# Patient Record
Sex: Male | Born: 1990 | Race: Black or African American | Hispanic: No | Marital: Single | Smoking: Current every day smoker
Health system: Southern US, Community
[De-identification: ages and names within clinical notes are randomized; demographics above are authoritative.]

## PROBLEM LIST (undated history)

## (undated) DIAGNOSIS — R001 Bradycardia, unspecified: Secondary | ICD-10-CM

## (undated) DIAGNOSIS — N4821 Abscess of corpus cavernosum and penis: Secondary | ICD-10-CM

## (undated) HISTORY — PX: HAND SURGERY: SHX662

---

## 2011-02-01 ENCOUNTER — Encounter: Payer: Self-pay | Admitting: Emergency Medicine

## 2011-02-01 ENCOUNTER — Emergency Department (HOSPITAL_COMMUNITY)
Admission: EM | Admit: 2011-02-01 | Discharge: 2011-02-01 | Disposition: A | Discharge status: Home / Self Care | Payer: No Typology Code available for payment source | Attending: Emergency Medicine | Admitting: Emergency Medicine

## 2011-02-01 DIAGNOSIS — M549 Dorsalgia, unspecified: Secondary | ICD-10-CM | POA: Insufficient documentation

## 2011-02-01 DIAGNOSIS — F17200 Nicotine dependence, unspecified, uncomplicated: Secondary | ICD-10-CM | POA: Insufficient documentation
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
## 2011-09-11 ENCOUNTER — Encounter (HOSPITAL_COMMUNITY): Payer: Self-pay | Admitting: Emergency Medicine

## 2011-09-11 ENCOUNTER — Inpatient Hospital Stay (HOSPITAL_COMMUNITY)
Admission: EM | Admit: 2011-09-11 | Discharge: 2011-09-15 | DRG: 709 | Disposition: A | Discharge status: Home / Self Care | Payer: Self-pay | Attending: Internal Medicine | Admitting: Internal Medicine

## 2011-09-11 DIAGNOSIS — R6510 Systemic inflammatory response syndrome (SIRS) of non-infectious origin without acute organ dysfunction: Secondary | ICD-10-CM | POA: Diagnosis present

## 2011-09-11 DIAGNOSIS — D696 Thrombocytopenia, unspecified: Secondary | ICD-10-CM | POA: Diagnosis present

## 2011-09-11 DIAGNOSIS — L03818 Cellulitis of other sites: Secondary | ICD-10-CM | POA: Diagnosis present

## 2011-09-11 DIAGNOSIS — I498 Other specified cardiac arrhythmias: Secondary | ICD-10-CM | POA: Diagnosis present

## 2011-09-11 DIAGNOSIS — L02818 Cutaneous abscess of other sites: Secondary | ICD-10-CM | POA: Diagnosis present

## 2011-09-11 DIAGNOSIS — L0291 Cutaneous abscess, unspecified: Secondary | ICD-10-CM

## 2011-09-11 DIAGNOSIS — N4829 Other inflammatory disorders of penis: Principal | ICD-10-CM | POA: Diagnosis present

## 2011-09-11 DIAGNOSIS — F17200 Nicotine dependence, unspecified, uncomplicated: Secondary | ICD-10-CM | POA: Diagnosis present

## 2011-09-11 DIAGNOSIS — B958 Unspecified staphylococcus as the cause of diseases classified elsewhere: Secondary | ICD-10-CM | POA: Diagnosis present

## 2011-09-11 DIAGNOSIS — R001 Bradycardia, unspecified: Secondary | ICD-10-CM | POA: Diagnosis present

## 2011-09-11 DIAGNOSIS — Z720 Tobacco use: Secondary | ICD-10-CM | POA: Diagnosis present

## 2011-09-11 HISTORY — DX: Abscess of corpus cavernosum and penis: N48.21

## 2011-09-11 HISTORY — DX: Bradycardia, unspecified: R00.1

## 2011-09-11 LAB — COMPREHENSIVE METABOLIC PANEL
ALT: 16 U/L (ref 0–53)
Calcium: 10.1 mg/dL (ref 8.4–10.5)
Creatinine, Ser: 1.27 mg/dL (ref 0.50–1.35)
GFR calc Af Amer: >90 mL/min (ref >90)
Glucose, Bld: 82 mg/dL (ref 70–99)
Sodium: 137 mEq/L (ref 135–145)
Total Protein: 7.8 g/dL (ref 6.0–8.3)

## 2011-09-11 LAB — DIFFERENTIAL
Eosinophils Absolute: 0 10*3/uL (ref 0.0–0.7)
Eosinophils Relative: 0 % (ref 0–5)
Lymphs Abs: 3.2 10*3/uL (ref 0.7–4.0)

## 2011-09-11 LAB — URINALYSIS, ROUTINE W REFLEX MICROSCOPIC
Bilirubin Urine: NEGATIVE
Leukocytes, UA: NEGATIVE
Nitrite: NEGATIVE
Specific Gravity, Urine: >1.03 — ABNORMAL HIGH (ref 1.005–1.030)
pH: 6 (ref 5.0–8.0)

## 2011-09-11 LAB — CBC
MCH: 30 pg (ref 26.0–34.0)
MCV: 86.7 fL (ref 78.0–100.0)
Platelets: 150 10*3/uL (ref 150–400)
RDW: 14.5 % (ref 11.5–15.5)

## 2011-09-11 MED ORDER — ACETAMINOPHEN 325 MG PO TABS
650.0000 mg | ORAL_TABLET | Freq: Once | ORAL | Status: AC
  Administered 2011-09-11: 650 mg via ORAL
  Filled 2011-09-11: qty 2

## 2011-09-11 MED ORDER — DEXTROSE 5 % IV SOLN
500.0000 mg | Freq: Once | INTRAVENOUS | Status: DC
  Filled 2011-09-11: qty 500

## 2011-09-11 MED ORDER — DEXTROSE 5 % IV SOLN
1.0000 g | Freq: Once | INTRAVENOUS | Status: AC
  Administered 2011-09-11: 1 g via INTRAVENOUS
  Filled 2011-09-11: qty 10

## 2011-09-11 MED ORDER — PIPERACILLIN-TAZOBACTAM 3.375 G IVPB
3.3750 g | Freq: Three times a day (TID) | INTRAVENOUS | Status: DC
  Administered 2011-09-11 – 2011-09-12 (×2): 3.375 g via INTRAVENOUS
  Filled 2011-09-11 (×6): qty 50

## 2011-09-11 MED ORDER — LIDOCAINE HCL (PF) 1 % IJ SOLN
INTRAMUSCULAR | Status: AC
  Filled 2011-09-11: qty 10

## 2011-09-11 MED ORDER — HYDROMORPHONE HCL PF 1 MG/ML IJ SOLN
1.0000 mg | Freq: Once | INTRAMUSCULAR | Status: AC
  Administered 2011-09-11: 1 mg via INTRAVENOUS
  Filled 2011-09-11: qty 1

## 2011-09-11 MED ORDER — VANCOMYCIN HCL IN DEXTROSE 1-5 GM/200ML-% IV SOLN
1000.0000 mg | Freq: Two times a day (BID) | INTRAVENOUS | Status: DC
  Administered 2011-09-11 – 2011-09-13 (×4): 1000 mg via INTRAVENOUS
  Filled 2011-09-11 (×4): qty 200

## 2011-09-11 MED ORDER — LIDOCAINE HCL (PF) 1 % IJ SOLN
30.0000 mL | Freq: Once | INTRAMUSCULAR | Status: DC
  Filled 2011-09-11: qty 30

## 2011-09-11 MED ORDER — DIPHENHYDRAMINE HCL 50 MG/ML IJ SOLN
25.0000 mg | Freq: Once | INTRAMUSCULAR | Status: AC
  Administered 2011-09-11: 25 mg via INTRAVENOUS
  Filled 2011-09-11: qty 1

## 2011-09-11 MED ORDER — ONDANSETRON HCL 4 MG/2ML IJ SOLN
4.0000 mg | Freq: Once | INTRAMUSCULAR | Status: AC
  Administered 2011-09-11: 4 mg via INTRAVENOUS
  Filled 2011-09-11: qty 2

## 2011-09-11 MED ORDER — SODIUM CHLORIDE 0.9 % IV BOLUS (SEPSIS)
1000.0000 mL | Freq: Once | INTRAVENOUS | Status: AC
  Administered 2011-09-11: 1000 mL via INTRAVENOUS

## 2011-09-11 NOTE — ED Notes (Signed)
Pt presents with "abscess on base of penis shaft and on head of penis" x 2 days. Pt reports having "boils before"  That go away after "popping them". Pt also presents with Fever, tachycardia and chills. Pt denies other complaints. The entire penis is swollen at this time. " open sore on shaft of penis has clear drainage. Pt denies discharge from penis and difficulty urinating.

## 2011-09-11 NOTE — Progress Notes (Signed)
ANTIBIOTIC CONSULT NOTE - INITIAL  Pharmacy Consult for Vancomycin and Zosyn Indication:  Abscess Penile Area  No Known Allergies  Patient Measurements: Height: 5\' 8"  (172.7 cm) Weight: 160 lb (72.576 kg) IBW/kg (Calculated) : 68.4    Vital Signs: Temp: 99.8 F (37.7 C) (04/06 2015) Temp src: Oral (04/06 2015) BP: 121/65 mmHg (04/06 2015) Pulse Rate: 101  (04/06 2015) Intake/Output from previous day:   Intake/Output from this shift:    Labs:  [facility] 09/11/11 1833  WBC 17.9*  HGB 16.0  PLT 150  LABCREA --  CREATININE 1.27   Estimated Creatinine Clearance: 88.3 ml/min (by C-G formula based on Cr of 1.27). No results found for this basename: VANCOTROUGH:2,VANCOPEAK:2,VANCORANDOM:2,GENTTROUGH:2,GENTPEAK:2,GENTRANDOM:2,TOBRATROUGH:2,TOBRAPEAK:2,TOBRARND:2,AMIKACINPEAK:2,AMIKACINTROU:2,AMIKACIN:2, in the last 72 hours   Microbiology: No results found for this or any previous visit (from the past 720 hour(s)).  Medical History: History reviewed. No pertinent past medical history.  Medications:  Scheduled:    . acetaminophen  650 mg Oral Once  .  HYDROmorphone (DILAUDID) injection  1 mg Intravenous Once  . lidocaine  30 mL Infiltration Once  . lidocaine      . ondansetron  4 mg Intravenous Once  . sodium chloride  1,000 mL Intravenous Once  . DISCONTD: azithromycin  500 mg Intravenous Once   Assessment: Ok for protocol  Goal of Therapy:  Vancomycin trough level 15-20 mcg/ml  Plan:  Zosyn 3.375 GM IV every 8 hours Vancomycin 1 GM IV every 12 hours Measure antibiotic drug levels at steady state  [address], Wylan Gentzler [place] 09/11/2011,9:02 PM

## 2011-09-11 NOTE — ED Provider Notes (Signed)
History    Scribed for Glynn Octave, MD, the patient was seen in room APA09/APA09. This chart was scribed by Katha Cabal.   CSN: [id number]  Arrival date & time 09/11/11  1742   First MD Initiated Contact with Patient 09/11/11 1811      Chief Complaint  Patient presents with  . Recurrent Skin Infections    (Consider location/radiation/quality/duration/timing/severity/associated sxs/prior treatment) HPI  Pt was seen at 6:18 PM   Christian Rose is a 21 y.o. male who presents to the Emergency Department complaining of boil on penis that is getting bigger for past 2 days.  Symptoms are associated with fever and mild suprapubic abdominal pain. Symptoms are not associated with testicular pain or dysuria.  Patient has tried to drain boil but it did not drain.  No history of syphilis.  Patient has been tested for HIV and syphilis.  Patient last had intercourse about a week ago.  No IV drug use.  No history of chronic medical problems.    No PCP      History reviewed. No pertinent past medical history.  Past Surgical History  Procedure Date  . Hand surgery     Family History  Problem Relation Age of Onset  . Diabetes Mother   . Schizophrenia Father     History  Substance Use Topics  . Smoking status: Current Everyday Smoker -- 0.5 packs/day    Types: Cigarettes  . Smokeless tobacco: Not on file  . Alcohol Use: 4.0 oz/week    8 drink(s) per week      Review of Systems  All other systems reviewed and are negative.    Allergies  Review of patient's allergies indicates no known allergies.  Home Medications  No current outpatient prescriptions on file.  BP 121/65  Pulse 101  Temp(Src) 99.8 F (37.7 C) (Oral)  Resp 18  Ht 5\' 8"  (1.727 m)  Wt 160 lb (72.576 kg)  BMI 24.33 kg/m2  SpO2 96%  Physical Exam  Nursing note and vitals reviewed. Constitutional: He is oriented to person, place, and time. He appears well-developed and well-nourished. No distress.    HENT:  Head: Normocephalic and atraumatic.  Eyes: Conjunctivae and EOM are normal.  Neck: Neck supple.  Cardiovascular: Normal rate and intact distal pulses.   Pulmonary/Chest: Effort normal. No respiratory distress.  Abdominal: There is no tenderness. There is no rebound and no guarding.  Genitourinary:       Swelling and induration with a pustule draining pus on left side, induration spreads to right side of penis, suspected abcess on penile shaft, nlm testicles, no penile drainage, tender inguinal lymphadenopathy, no hernia.  No chancres.  Musculoskeletal: Normal range of motion. He exhibits no edema.  Neurological: He is alert and oriented to person, place, and time. No sensory deficit. Coordination normal.  Skin: Skin is warm and dry.       See GU exam   Psychiatric: He has a normal mood and affect. His behavior is normal.    ED Course  Procedures (including critical care time)   DIAGNOSTIC STUDIES: Oxygen Saturation is 100% on room air, normal by my interpretation.     COORDINATION OF CARE: 6:23 PM  Physical exam complete.  Will drain abscess.   6:30 PM  Zofran and Dilaudid ordered.    7:15 PM  Zithromax and Rocephin ordered.   7:24 PM  Consulted with urologist Dr. Jerre Simon.  Plan to admit patient.      LABS / RADIOLOGY:  Labs Reviewed  CBC - Abnormal; Notable for the following:    WBC 17.9 (*)    All other components within normal limits  DIFFERENTIAL - Abnormal; Notable for the following:    Neutro Abs 13.3 (*)    Monocytes Absolute 1.3 (*)    All other components within normal limits  COMPREHENSIVE METABOLIC PANEL - Abnormal; Notable for the following:    GFR calc non Af Amer 79 (*)    All other components within normal limits  URINALYSIS, ROUTINE W REFLEX MICROSCOPIC - Abnormal; Notable for the following:    Specific Gravity, Urine >1.030 (*)    All other components within normal limits  GC/CHLAMYDIA PROBE AMP, GENITAL  RPR   No results  found.       MDM  Penile cellulitis and swelling to the penile shaft for 2 days. Patient comes in tachycardic and febrile. No difficulty with urination, testicles benign.  Patient has abscess to penile shaft on exam with leukocytosis, tachycardia and fever consistent with sepsis. Start IV antibiotics, obtain STD labs  D/w urology Dr. Jerre Simon who agrees with admission for IV antibiotics and he'll consult on patient.  He does not recommend that I try to I+D abscess.         MEDICATIONS GIVEN IN THE E.D. Scheduled Meds:    . acetaminophen  650 mg Oral Once  . azithromycin  500 mg Intravenous Once  .  HYDROmorphone (DILAUDID) injection  1 mg Intravenous Once  . lidocaine  30 mL Infiltration Once  . lidocaine      . ondansetron  4 mg Intravenous Once  . sodium chloride  1,000 mL Intravenous Once   Continuous Infusions:    . cefTRIAXone (ROCEPHIN)  IV         IMPRESSION: 1. Cellulitis and abscess      NEW MEDICATIONS: New Prescriptions   No medications on file      I personally performed the services described in this documentation, which was scribed in my presence.  The recorded information has been reviewed and considered.        Glynn Octave, MD 09/12/11 1446

## 2011-09-11 NOTE — ED Notes (Signed)
Pt has ? Abcess on Penile area x 2 days

## 2011-09-11 NOTE — H&P (Signed)
PCP:   None  Consultants: Alleen Borne,  M.D.  Chief Complaint: Boil on penis for 3 days  HPI: Christian Rose is an 21 y.o. male.   No chronic medical problems, no personal history of diabetes, has been picking at a pimple arising from follicle at the base of his penis for the past 3 days. However, has been getting progressively larger and draining pus, related to his attempts to self manage by using hot compresses. As an extensive tear of the base of the penis is now become swollen inflamed, and he has developed tenderness in both groins, he came to the emergency room for evaluation.   He denies any dysuria, frequency or penile discharge. He denies fever or chills. Last intercourse one week ago.   Rewiew of Systems:  The patient denies anorexia, fever, weight loss,, vision loss, decreased hearing, hoarseness, chest pain, syncope, dyspnea on exertion, peripheral edema, balance deficits, hemoptysis, abdominal pain, melena, hematochezia, severe indigestion/heartburn, hematuria, incontinence, other genital sores, muscle weakness, suspicious skin lesions, transient blindness, difficulty walking, depression, unusual weight change, abnormal bleeding,  angioedema, and breast masses.    History reviewed. No pertinent past medical history.  Past Surgical History  Procedure Date  . Hand surgery     Medications:  HOME MEDS: Prior to Admission medications   Not on File     Allergies:  No Known Allergies  Social History:   reports that he has been smoking Cigarettes.  He has been smoking about .5 packs per day. He does not have any smokeless tobacco history on file. He reports that he drinks about 4 ounces of alcohol per week. He reports that he does not use illicit drugs.  Family History: Family History  Problem Relation Age of Onset  . Diabetes Mother   . Schizophrenia Father      Physical Exam: Filed Vitals:   09/11/11 1749 09/11/11 1907 09/11/11 2015  BP: 151/92  122/86 121/65  Pulse: 121 74 101  Temp: 100.2 F (37.9 C)  99.8 F (37.7 C)  TempSrc: Oral  Oral  Resp: 20 18   Height: 5\' 8"  (1.727 m)    Weight: 72.576 kg (160 lb)    SpO2: 100% 99% 96%   Blood pressure 121/65, pulse 101, temperature 99.8 F (37.7 C), temperature source Oral, resp. rate 18, height 5\' 8"  (1.727 m), weight 72.576 kg (160 lb), SpO2 96.00%.  GEN:  Pleasant young African American man lying in the stretcher in no acute distress; cooperative with exam PSYCH:  alert and oriented x4;  affect is appropriate. HEENT: Mucous membranes pink and anicteric; PERRLA; EOM intact; no cervical lymphadenopathy nor thyromegaly or carotid bruit; no JVD; Breasts:: Not examined CHEST WALL: No tenderness CHEST: Normal respiration, clear to auscultation bilaterally HEART: Tachycardic, regular rhythm.; no murmurs rubs or gallops BACK: No kyphosis or scoliosis; no CVA tenderness ABDOMEN: Scaphoid, soft non-tender; no masses, no organomegaly, normal abdominal bowel sounds;  Rectal Exam: Not done EXTREMITIES: No bone or joint deformity;  no edema; no ulcerations. Genitalia: Circumcised penis; exquisite tender induration of dorsal base of the penis; a dry pus at the left. No ulcers no chancre;  bilateral tender inguinal lymphadenopathy .  PULSES: 2+ and symmetric SKIN: Normal hydration no rash or ulceration CNS: Cranial nerves 2-12 grossly intact no focal lateralizing neurologic deficit   Labs & Imaging Results for orders placed during the hospital encounter of 09/11/11 (from the past 48 hour(s))  CBC     Status: Abnormal   Collection  Time   09/11/11  6:33 PM      Component Value Range Comment   WBC 17.9 (*) 4.0 - 10.5 (K/uL)    RBC 5.33  4.22 - 5.81 (MIL/uL)    Hemoglobin 16.0  13.0 - 17.0 (g/dL)    HCT 16.1  09.6 - 04.5 (%)    MCV 86.7  78.0 - 100.0 (fL)    MCH 30.0  26.0 - 34.0 (pg)    MCHC 34.6  30.0 - 36.0 (g/dL)    RDW 40.9  81.1 - 91.4 (%)    Platelets 150  150 - 400 (K/uL)     DIFFERENTIAL     Status: Abnormal   Collection Time   09/11/11  6:33 PM      Component Value Range Comment   Neutrophils Relative 74  43 - 77 (%)    Neutro Abs 13.3 (*) 1.7 - 7.7 (K/uL)    Lymphocytes Relative 18  12 - 46 (%)    Lymphs Abs 3.2  0.7 - 4.0 (K/uL)    Monocytes Relative 7  3 - 12 (%)    Monocytes Absolute 1.3 (*) 0.1 - 1.0 (K/uL)    Eosinophils Relative 0  0 - 5 (%)    Eosinophils Absolute 0.0  0.0 - 0.7 (K/uL)    Basophils Relative 0  0 - 1 (%)    Basophils Absolute 0.0  0.0 - 0.1 (K/uL)   COMPREHENSIVE METABOLIC PANEL     Status: Abnormal   Collection Time   09/11/11  6:33 PM      Component Value Range Comment   Sodium 137  135 - 145 (mEq/L)    Potassium 3.9  3.5 - 5.1 (mEq/L)    Chloride 100  96 - 112 (mEq/L)    CO2 24  19 - 32 (mEq/L)    Glucose, Bld 82  70 - 99 (mg/dL)    BUN 15  6 - 23 (mg/dL)    Creatinine, Ser 7.82  0.50 - 1.35 (mg/dL)    Calcium 95.6  8.4 - 10.5 (mg/dL)    Total Protein 7.8  6.0 - 8.3 (g/dL)    Albumin 4.1  3.5 - 5.2 (g/dL)    AST 21  0 - 37 (U/L)    ALT 16  0 - 53 (U/L)    Alkaline Phosphatase 87  39 - 117 (U/L)    Total Bilirubin 0.5  0.3 - 1.2 (mg/dL)    GFR calc non Af Amer 79 (*) >90 (mL/min)    GFR calc Af Amer >90  >90 (mL/min)   URINALYSIS, ROUTINE W REFLEX MICROSCOPIC     Status: Abnormal   Collection Time   09/11/11  6:49 PM      Component Value Range Comment   Color, Urine YELLOW  YELLOW     APPearance CLEAR  CLEAR     Specific Gravity, Urine >1.030 (*) 1.005 - 1.030     pH 6.0  5.0 - 8.0     Glucose, UA NEGATIVE  NEGATIVE (mg/dL)    Hgb urine dipstick NEGATIVE  NEGATIVE     Bilirubin Urine NEGATIVE  NEGATIVE     Ketones, ur NEGATIVE  NEGATIVE (mg/dL)    Protein, ur NEGATIVE  NEGATIVE (mg/dL)    Urobilinogen, UA 0.2  0.0 - 1.0 (mg/dL)    Nitrite NEGATIVE  NEGATIVE     Leukocytes, UA NEGATIVE  NEGATIVE  MICROSCOPIC NOT DONE ON URINES WITH NEGATIVE PROTEIN, BLOOD, LEUKOCYTES, NITRITE, OR GLUCOSE <1000 mg/dL.  No results  found.    Assessment Present on Admission:   SIRS .Cellulitis and abscess of shaft of penis, causing the above  .Tobacco abuse   PLAN: Broad-spectrum antibiotic to include [name]; Vigorous fluid hydration pain control  Counsel on nicotine cessation  Other plans as per orders.    Ryon Layton 09/11/2011, 10:22 PM

## 2011-09-12 ENCOUNTER — Encounter (HOSPITAL_COMMUNITY): Payer: Self-pay | Admitting: *Deleted

## 2011-09-12 LAB — CBC
HCT: 40.9 % (ref 39.0–52.0)
MCHC: 33 g/dL (ref 30.0–36.0)
RDW: 14.8 % (ref 11.5–15.5)

## 2011-09-12 LAB — GLUCOSE, CAPILLARY: Glucose-Capillary: 112 mg/dL — ABNORMAL HIGH (ref 70–99)

## 2011-09-12 LAB — RPR: RPR Ser Ql: NONREACTIVE

## 2011-09-12 MED ORDER — PIPERACILLIN-TAZOBACTAM 3.375 G IVPB
INTRAVENOUS | Status: AC
  Filled 2011-09-12: qty 50

## 2011-09-12 MED ORDER — HYDROMORPHONE HCL PF 1 MG/ML IJ SOLN: 0.5000 mg | INTRAMUSCULAR | Status: DC | PRN

## 2011-09-12 MED ORDER — ACETAMINOPHEN 650 MG RE SUPP: 650.0000 mg | Freq: Four times a day (QID) | RECTAL | Status: DC | PRN

## 2011-09-12 MED ORDER — OXYCODONE HCL 5 MG PO TABS
5.0000 mg | ORAL_TABLET | ORAL | Status: DC | PRN
Start: 2011-09-12 — End: 2011-09-14
  Administered 2011-09-12 – 2011-09-13 (×6): 5 mg via ORAL
  Filled 2011-09-12 (×6): qty 1

## 2011-09-12 MED ORDER — SODIUM CHLORIDE 0.9 % IJ SOLN
INTRAMUSCULAR | Status: AC
  Administered 2011-09-12: 15:00:00
  Filled 2011-09-12: qty 3

## 2011-09-12 MED ORDER — SODIUM CHLORIDE 0.9 % IJ SOLN
INTRAMUSCULAR | Status: AC
  Administered 2011-09-12: 16:00:00
  Filled 2011-09-12: qty 3

## 2011-09-12 MED ORDER — ENOXAPARIN SODIUM 40 MG/0.4ML ~~LOC~~ SOLN
40.0000 mg | Freq: Every day | SUBCUTANEOUS | Status: DC
  Administered 2011-09-12 – 2011-09-13 (×2): 40 mg via SUBCUTANEOUS
  Filled 2011-09-12 (×3): qty 0.4

## 2011-09-12 MED ORDER — TRAZODONE HCL 50 MG PO TABS: 25.0000 mg | ORAL_TABLET | Freq: Every evening | ORAL | Status: DC | PRN

## 2011-09-12 MED ORDER — SODIUM CHLORIDE 0.9 % IJ SOLN
INTRAMUSCULAR | Status: AC
  Administered 2011-09-12: 17:00:00
  Filled 2011-09-12: qty 3

## 2011-09-12 MED ORDER — POTASSIUM CHLORIDE IN NACL 20-0.9 MEQ/L-% IV SOLN
INTRAVENOUS | Status: DC
  Administered 2011-09-12 (×2): via INTRAVENOUS

## 2011-09-12 MED ORDER — NICOTINE 14 MG/24HR TD PT24: 14.0000 mg | MEDICATED_PATCH | Freq: Every day | TRANSDERMAL | Status: DC | PRN

## 2011-09-12 MED ORDER — ACETAMINOPHEN 325 MG PO TABS: 650.0000 mg | ORAL_TABLET | Freq: Four times a day (QID) | ORAL | Status: DC | PRN

## 2011-09-12 NOTE — Progress Notes (Signed)
Chart reviewed.  Subjective: Has some purulent drainage. Concerned that the infection might be spreading.  Objective: Vital signs in last 24 hours: Filed Vitals:   09/12/11 0459 09/12/11 0515 09/12/11 0957 09/12/11 1031  BP:  98/59  102/65  Pulse:  62 67 75  Temp:  97.9 F (36.6 C)  98.3 F (36.8 C)  TempSrc:  Oral  Oral  Resp:  18  18  Height:      Weight: 84.1 kg (185 lb 6.5 oz)     SpO2:  99% 99% 98%   Weight change:   Intake/Output Summary (Last 24 hours) at 09/12/11 1154 Last data filed at 09/12/11 0900  Gross per 24 hour  Intake    340 ml  Output   1450 ml  Net  -1110 ml   Physical Exam:  Gen nontoxic Lungs clear to auscultation bilaterally without wheeze rhonchi or rales Cardiovascular regular rate rhythm without murmurs gallops rubs Abdomen soft nontender nondistended Extremities no clubbing cyanosis or edema GU: Induration involving the dorsum of the penis. No erythema.  Lab Results: Basic Metabolic Panel:  Lab 09/11/11 1610  NA 137  K 3.9  CL 100  CO2 24  GLUCOSE 82  BUN 15  CREATININE 1.27  CALCIUM 10.1  MG --  PHOS --   Liver Function Tests:  Lab 09/11/11 1833  AST 21  ALT 16  ALKPHOS 87  BILITOT 0.5  PROT 7.8  ALBUMIN 4.1   No results found for this basename: LIPASE:2,AMYLASE:2 in the last 168 hours No results found for this basename: AMMONIA:2 in the last 168 hours CBC:  Lab 09/12/11 0535 09/11/11 1833  WBC 12.2* 17.9*  NEUTROABS -- 13.3*  HGB 13.5 16.0  HCT 40.9 46.2  MCV 88.0 86.7  PLT 132* 150   Cardiac Enzymes: No results found for this basename: CKTOTAL:3,CKMB:3,CKMBINDEX:3,TROPONINI:3 in the last 168 hours BNP: No results found for this basename: PROBNP:3 in the last 168 hours D-Dimer: No results found for this basename: DDIMER:2 in the last 168 hours CBG: No results found for this basename: GLUCAP:6 in the last 168 hours Hemoglobin A1C: No results found for this basename: HGBA1C in the last 168 hours Fasting  Lipid Panel: No results found for this basename: CHOL,HDL,LDLCALC,TRIG,CHOLHDL,LDLDIRECT in the last 960 hours Thyroid Function Tests: No results found for this basename: TSH,T4TOTAL,FREET4,T3FREE,THYROIDAB in the last 168 hours Coagulation: No results found for this basename: LABPROT:4,INR:4 in the last 168 hours Anemia Panel: No results found for this basename: VITAMINB12,FOLATE,FERRITIN,TIBC,IRON,RETICCTPCT in the last 168 hours Urine Drug Screen: Drugs of Abuse  No results found for this basename: labopia, cocainscrnur, labbenz, amphetmu, thcu, labbarb    Alcohol Level: No results found for this basename: ETH:2 in the last 168 hours Urinalysis:  Lab 09/11/11 1849  COLORURINE YELLOW  LABSPEC >1.030*  PHURINE 6.0  GLUCOSEU NEGATIVE  HGBUR NEGATIVE  BILIRUBINUR NEGATIVE  KETONESUR NEGATIVE  PROTEINUR NEGATIVE  UROBILINOGEN 0.2  NITRITE NEGATIVE  LEUKOCYTESUR NEGATIVE   Micro Results: No results found for this or any previous visit (from the past 240 hour(s)). Studies/Results: No results found.  Scheduled Meds:   . acetaminophen  650 mg Oral Once  . cefTRIAXone (ROCEPHIN)  IV  1 g Intravenous Once  . diphenhydrAMINE  25 mg Intravenous Once  . enoxaparin  40 mg Subcutaneous Daily  .  HYDROmorphone (DILAUDID) injection  1 mg Intravenous Once  . lidocaine      . ondansetron  4 mg Intravenous Once  . sodium chloride  1,000  mL Intravenous Once  . vancomycin  1,000 mg Intravenous Q12H  . DISCONTD: azithromycin  500 mg Intravenous Once  . DISCONTD: lidocaine  30 mL Infiltration Once  . DISCONTD: piperacillin-tazobactam (ZOSYN)  IV  3.375 g Intravenous Q8H   Continuous Infusions:   . 0.9 % NaCl with KCl 20 mEq / L 150 mL/hr at 09/12/11 0226   PRN Meds:.acetaminophen, acetaminophen, HYDROmorphone, nicotine, oxyCODONE, traZODone Assessment/Plan: Active Problems:  Cellulitis and abscess of other specified site, likely staph infection. Continue vancomycin. Stop Zosyn. Will  order warm compresses to promote drainage. Await urology evaluation. RPR, GC, chlamydia pending. Decrease IV fluids.  Tobacco abuse   LOS: 1 day   Ever Gustafson L 09/12/2011, 11:54 AM

## 2011-09-12 NOTE — Consult Note (Signed)
Report#[id number]

## 2011-09-12 NOTE — Progress Notes (Addendum)
ANTIBIOTIC CONSULT NOTE   Pharmacy Consult for Vancomycin and Zosyn Indication: Cellulitis and abscess at base of penis  No Known Allergies  Patient Measurements: Height: 5\' 9"  (175.3 cm) Weight: 185 lb 6.5 oz (84.1 kg) IBW/kg (Calculated) : 70.7   Vital Signs: Temp: 97.9 F (36.6 C) (04/07 0515) Temp src: Oral (04/07 0515) BP: 98/59 mmHg (04/07 0515) Pulse Rate: 67  (04/07 0957) Intake/Output from previous day: 04/06 0701 - 04/07 0700 In: 340 [P.O.:340] Out: 1000 [Urine:1000] Intake/Output from this shift:    Labs:  Basename 09/12/11 0535 09/11/11 1833  WBC 12.2* 17.9*  HGB 13.5 16.0  PLT 132* 150  LABCREA -- --  CREATININE -- 1.27   Estimated Creatinine Clearance: 91.2 ml/min (by C-G formula based on Cr of 1.27). No results found for this basename: VANCOTROUGH:2,VANCOPEAK:2,VANCORANDOM:2,GENTTROUGH:2,GENTPEAK:2,GENTRANDOM:2,TOBRATROUGH:2,TOBRAPEAK:2,TOBRARND:2,AMIKACINPEAK:2,AMIKACINTROU:2,AMIKACIN:2, in the last 72 hours   Microbiology: No results found for this or any previous visit (from the past 720 hour(s)).  Medical History: History reviewed. No pertinent past medical history.  Medications:  Scheduled:     . acetaminophen  650 mg Oral Once  . cefTRIAXone (ROCEPHIN)  IV  1 g Intravenous Once  . diphenhydrAMINE  25 mg Intravenous Once  . enoxaparin  40 mg Subcutaneous Daily  .  HYDROmorphone (DILAUDID) injection  1 mg Intravenous Once  . lidocaine      . ondansetron  4 mg Intravenous Once  . piperacillin-tazobactam (ZOSYN)  IV  3.375 g Intravenous Q8H  . sodium chloride  1,000 mL Intravenous Once  . vancomycin  1,000 mg Intravenous Q12H  . DISCONTD: azithromycin  500 mg Intravenous Once  . DISCONTD: lidocaine  30 mL Infiltration Once   Assessment: Ok for protocol  Goal of Therapy:  Vancomycin trough level 10-15  Plan:  Zosyn 3.375 GM IV every 8 hours Vancomycin 1 GM IV every 12 hours Check trough tomorrow  Margo Aye, Daouda Lonzo A 09/12/2011,10:15  AM

## 2011-09-13 DIAGNOSIS — D696 Thrombocytopenia, unspecified: Secondary | ICD-10-CM | POA: Diagnosis not present

## 2011-09-13 LAB — GC/CHLAMYDIA PROBE AMP, GENITAL
Chlamydia, DNA Probe: NEGATIVE
GC Probe Amp, Genital: NEGATIVE

## 2011-09-13 MED ORDER — VANCOMYCIN HCL IN DEXTROSE 1-5 GM/200ML-% IV SOLN
1000.0000 mg | Freq: Three times a day (TID) | INTRAVENOUS | Status: DC
  Administered 2011-09-13 – 2011-09-14 (×3): 1000 mg via INTRAVENOUS
  Filled 2011-09-13 (×6): qty 200

## 2011-09-13 MED ORDER — SODIUM CHLORIDE 0.9 % IJ SOLN
INTRAMUSCULAR | Status: AC
  Filled 2011-09-13: qty 3

## 2011-09-13 NOTE — Progress Notes (Signed)
Subjective  Patient says that the abscessed area appears to be draining more. He has mild discomfort but no outright pain. He has no pain with urination. No subjective fever or chills.  Objective: Vital signs in last 24 hours: Filed Vitals:   09/12/11 2141 09/13/11 0230 09/13/11 0500 09/13/11 1004  BP: 124/74 112/64 129/72 114/68  Pulse: 78 65 69 97  Temp: 98.6 F (37 C) 98.3 F (36.8 C) 97.3 F (36.3 C) 97.8 F (36.6 C)  TempSrc: Oral Oral Oral Oral  Resp: 18 18 18 20   Height:      Weight:   82.4 kg (181 lb 10.5 oz)   SpO2: 98% 99% 97% 99%   Weight change: 9.824 kg (21 lb 10.5 oz)  Intake/Output Summary (Last 24 hours) at 09/13/11 1123 Last data filed at 09/13/11 1013  Gross per 24 hour  Intake    880 ml  Output   2850 ml  Net  -1970 ml   Physical Exam:  Gen nontoxic Lungs clear to auscultation bilaterally without wheeze rhonchi or rales Cardiovascular regular rate rhythm without murmurs gallops rubs Abdomen soft nontender nondistended Extremities no clubbing cyanosis or edema GU: Induration involving the dorsum of the penis. Small areas pimple- like abscesses  actively draining a small amount of purulent fluid. Tender to palpation.  Lab Results: Basic Metabolic Panel:  Lab 09/11/11 1610  NA 137  K 3.9  CL 100  CO2 24  GLUCOSE 82  BUN 15  CREATININE 1.27  CALCIUM 10.1  MG --  PHOS --   Liver Function Tests:  Lab 09/11/11 1833  AST 21  ALT 16  ALKPHOS 87  BILITOT 0.5  PROT 7.8  ALBUMIN 4.1   No results found for this basename: LIPASE:2,AMYLASE:2 in the last 168 hours No results found for this basename: AMMONIA:2 in the last 168 hours CBC:  Lab 09/12/11 0535 09/11/11 1833  WBC 12.2* 17.9*  NEUTROABS -- 13.3*  HGB 13.5 16.0  HCT 40.9 46.2  MCV 88.0 86.7  PLT 132* 150   Cardiac Enzymes: No results found for this basename: CKTOTAL:3,CKMB:3,CKMBINDEX:3,TROPONINI:3 in the last 168 hours BNP: No results found for this basename: PROBNP:3 in the  last 168 hours D-Dimer: No results found for this basename: DDIMER:2 in the last 168 hours CBG:  Lab 09/12/11 2057  GLUCAP 112*   Hemoglobin A1C: No results found for this basename: HGBA1C in the last 168 hours Fasting Lipid Panel: No results found for this basename: CHOL,HDL,LDLCALC,TRIG,CHOLHDL,LDLDIRECT in the last 960 hours Thyroid Function Tests: No results found for this basename: TSH,T4TOTAL,FREET4,T3FREE,THYROIDAB in the last 168 hours Coagulation: No results found for this basename: LABPROT:4,INR:4 in the last 168 hours Anemia Panel: No results found for this basename: VITAMINB12,FOLATE,FERRITIN,TIBC,IRON,RETICCTPCT in the last 168 hours Urine Drug Screen: Drugs of Abuse  No results found for this basename: labopia,  cocainscrnur,  labbenz,  amphetmu,  thcu,  labbarb    Alcohol Level: No results found for this basename: ETH:2 in the last 168 hours Urinalysis:  Lab 09/11/11 1849  COLORURINE YELLOW  LABSPEC >1.030*  PHURINE 6.0  GLUCOSEU NEGATIVE  HGBUR NEGATIVE  BILIRUBINUR NEGATIVE  KETONESUR NEGATIVE  PROTEINUR NEGATIVE  UROBILINOGEN 0.2  NITRITE NEGATIVE  LEUKOCYTESUR NEGATIVE   Micro Results: No results found for this or any previous visit (from the past 240 hour(s)). Studies/Results: No results found.  Scheduled Meds:    . enoxaparin  40 mg Subcutaneous Daily  . sodium chloride      . sodium chloride      .  sodium chloride      . vancomycin  1,000 mg Intravenous Q8H  . DISCONTD: piperacillin-tazobactam (ZOSYN)  IV  3.375 g Intravenous Q8H  . DISCONTD: vancomycin  1,000 mg Intravenous Q12H   Continuous Infusions:    . 0.9 % NaCl with KCl 20 mEq / L 20 mL/hr at 09/12/11 1410   PRN Meds:.acetaminophen, acetaminophen, HYDROmorphone, nicotine, oxyCODONE, traZODone Assessment/Plan:  Active Problems:  Cellulitis and abscess of other specified site, likely staph infection. Continue vancomycin and Rocephin. The Zosyn was discontinued yesterday.  Continue warm compresses to promote drainage. Dr. Fawn Kirk consult noted and appreciated. RPR is negative the GC and Chlamydia are still pending.   Tobacco abuse. Tobacco cessation counseling was ordered.  Thrombocytopenia. This may be secondary to antibiotics. His platelet count will be followed.    LOS: 2 days   Christian Rose 09/13/2011, 11:23 AM

## 2011-09-13 NOTE — Progress Notes (Signed)
Report [phone]

## 2011-09-13 NOTE — Progress Notes (Signed)
ANTIBIOTIC CONSULT NOTE   Pharmacy Consult for Vancomycin Indication: Cellulitis and abscess at base of penis  No Known Allergies  Patient Measurements: Height: 5\' 9"  (175.3 cm) Weight: 181 lb 10.5 oz (82.4 kg) IBW/kg (Calculated) : 70.7   Vital Signs: Temp: 97.8 F (36.6 C) (04/08 1004) Temp src: Oral (04/08 1004) BP: 114/68 mmHg (04/08 1004) Pulse Rate: 97  (04/08 1004) Intake/Output from previous day: 04/07 0701 - 04/08 0700 In: 240 [P.O.:240] Out: 2950 [Urine:2950] Intake/Output from this shift: Total I/O In: 640 [P.O.:640] Out: 350 [Urine:350]  Labs:  [facility] 09/12/11 0535 09/11/11 1833  WBC 12.2* 17.9*  HGB 13.5 16.0  PLT 132* 150  LABCREA -- --  CREATININE -- 1.27   Estimated Creatinine Clearance: 91.2 ml/min (by C-G formula based on Cr of 1.27).  Basename 09/13/11 0837  VANCOTROUGH <5.0*  VANCOPEAK --  Drue Dun --  GENTTROUGH --  GENTPEAK --  GENTRANDOM --  TOBRATROUGH --  TOBRAPEAK --  TOBRARND --  AMIKACINPEAK --  AMIKACINTROU --  AMIKACIN --     Microbiology: No results found for this or any previous visit (from the past 720 hour(s)).  Medical History: History reviewed. No pertinent past medical history.  Medications:  Scheduled:     . enoxaparin  40 mg Subcutaneous Daily  . sodium chloride      . sodium chloride      . sodium chloride      . vancomycin  1,000 mg Intravenous Q12H  . DISCONTD: piperacillin-tazobactam (ZOSYN)  IV  3.375 g Intravenous Q8H   Assessment: Ok for protocol Trough below goal.  Goal of Therapy:  Vancomycin trough level 10-15  Plan:  Increase Vancomycin 1 GM IV every 8 hours Check trough tomorrow  Mady Gemma 09/13/2011,10:20 AM

## 2011-09-13 NOTE — Consult Note (Signed)
NAME:  Christian Rose, VORE NO.:  [id number]  MEDICAL RECORD NO.:  [id number]  LOCATION:  A305                          FACILITY:  [facility]  PHYSICIAN:  Ky Barban, M.D.DATE OF BIRTH:  07-19-1990  DATE OF CONSULTATION: DATE OF DISCHARGE:                                CONSULTATION   CHIEF COMPLAINT:  Painful swelling of the base of the penis.  HISTORY:  A 21 year old gentleman who was admitted through the emergency room yesterday.  I called him to see him today because he has a painful swelling on the base of his penis.  He noticed a pimple on the base of the penis, and 3 days ago, started getting progressively worse with swelling and also painful knots in both groin.  No fever or chills.  He has no urinary symptoms.  He was admitted in the hospital with diagnosis of cellulitis, and started him on vancomycin, since yesterday, he is feeling better.  I do not see any drainage from the penis.  That area is dry and extremely tender.  The swelling he says it has gone down.  It is slightly fluctuant, but the fluctuation is not extensive, most of it is induration.  PAST MEDICAL HISTORY:  He denies any history of diabetes or hypertension or any other serious medical problem.  PERSONAL HISTORY:  Smokes 5 packs of cigarettes daily and drinks 4 ounces of alcohol per week.  REVIEW OF SYSTEMS:  Unremarkable.  PHYSICAL EXAMINATION:  VITAL SIGNS:  Blood pressure is 151/92, temperature is 100.2 on admission, today it is 98.6.  Pulse is 101 per minute.  On admission, today it is normal. ABDOMEN:  On examination, abdomen is soft, flat.  Liver, spleen, kidneys not palpable. 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  There is an area of the base of the penis, midline, dorsally.  There is dried pimple like area, but no drainage, but the area around it maybe 1 cm totally, slightly fluctuant.   Rest of the area which spreads into the prepubic space is indurated and tender, and also there is some tender lymph node palpable in the right groin, nothing on the left side.  The lymph node which is tender and palpable is discrete and mobile, located on the right.  Groin and the size is about 1 cm.  WBC count on admission was 17.9, today it is 12.2.  His hematocrit on admission 46.2, and today it is 40.9.  Urinalysis also looks normal.  IMPRESSION:  Cellulitis, base of the penis.  RECOMMEND:  Continue IV vancomycin.  I will check him again.  His problem might get better just with antibiotic.  It is very small fluctuant area, so I am not going to drain it, especially he is getting better.  I will recheck it tomorrow, then decide if I need to incise this and drain it.     Ky Barban, M.D.     MIJ/MEDQ  D:  09/12/2011  T:  09/13/2011  Job:  [id number]

## 2011-09-14 ENCOUNTER — Encounter (HOSPITAL_COMMUNITY)
Admission: EM | Disposition: A | Discharge status: Home / Self Care | Payer: Self-pay | Source: Home / Self Care | Attending: Internal Medicine

## 2011-09-14 ENCOUNTER — Encounter (HOSPITAL_COMMUNITY): Payer: Self-pay | Admitting: *Deleted

## 2011-09-14 ENCOUNTER — Inpatient Hospital Stay (HOSPITAL_COMMUNITY): Payer: Self-pay | Admitting: Anesthesiology

## 2011-09-14 ENCOUNTER — Encounter (HOSPITAL_COMMUNITY): Payer: Self-pay | Admitting: Anesthesiology

## 2011-09-14 HISTORY — PX: IRRIGATION AND DEBRIDEMENT ABSCESS: SHX5252

## 2011-09-14 SURGERY — IRRIGATION AND DEBRIDEMENT ABSCESS
Anesthesia: General | Site: Penis | Wound class: Dirty or Infected

## 2011-09-14 MED ORDER — VANCOMYCIN HCL IN DEXTROSE 1-5 GM/200ML-% IV SOLN: 1000.0000 mg | INTRAVENOUS | Status: DC

## 2011-09-14 MED ORDER — ONDANSETRON HCL 4 MG/2ML IJ SOLN: 4.0000 mg | Freq: Once | INTRAMUSCULAR | Status: DC | PRN

## 2011-09-14 MED ORDER — FENTANYL CITRATE 0.05 MG/ML IJ SOLN
INTRAMUSCULAR | Status: AC
  Administered 2011-09-14: 50 ug via INTRAVENOUS
  Filled 2011-09-14: qty 5

## 2011-09-14 MED ORDER — FENTANYL CITRATE 0.05 MG/ML IJ SOLN
INTRAMUSCULAR | Status: AC
  Administered 2011-09-14: 50 ug via INTRAVENOUS
  Filled 2011-09-14: qty 2

## 2011-09-14 MED ORDER — PROPOFOL 10 MG/ML IV EMUL
INTRAVENOUS | Status: AC
  Filled 2011-09-14: qty 20

## 2011-09-14 MED ORDER — MIDAZOLAM HCL 2 MG/2ML IJ SOLN
INTRAMUSCULAR | Status: AC
  Filled 2011-09-14: qty 2

## 2011-09-14 MED ORDER — LIDOCAINE HCL (PF) 1 % IJ SOLN
INTRAMUSCULAR | Status: AC
  Filled 2011-09-14: qty 5

## 2011-09-14 MED ORDER — FENTANYL CITRATE 0.05 MG/ML IJ SOLN
INTRAMUSCULAR | Status: DC | PRN
  Administered 2011-09-14: 50 ug via INTRAVENOUS
  Administered 2011-09-14: 25 ug via INTRAVENOUS
  Administered 2011-09-14: 50 ug via INTRAVENOUS
  Administered 2011-09-14 (×3): 25 ug via INTRAVENOUS
  Administered 2011-09-14: 50 ug via INTRAVENOUS

## 2011-09-14 MED ORDER — VANCOMYCIN HCL IN DEXTROSE 1-5 GM/200ML-% IV SOLN
1000.0000 mg | Freq: Three times a day (TID) | INTRAVENOUS | Status: DC
  Administered 2011-09-14 – 2011-09-15 (×3): 1000 mg via INTRAVENOUS
  Filled 2011-09-14 (×6): qty 200

## 2011-09-14 MED ORDER — LACTATED RINGERS IV SOLN
INTRAVENOUS | Status: DC
  Administered 2011-09-14: 1000 mL via INTRAVENOUS

## 2011-09-14 MED ORDER — OXYCODONE-ACETAMINOPHEN 5-325 MG PO TABS
1.0000 | ORAL_TABLET | ORAL | Status: DC | PRN
  Administered 2011-09-14 – 2011-09-15 (×2): 1 via ORAL
  Filled 2011-09-14 (×2): qty 1

## 2011-09-14 MED ORDER — LIDOCAINE HCL 1 % IJ SOLN
INTRAMUSCULAR | Status: DC | PRN
  Administered 2011-09-14: 40 mg via INTRADERMAL

## 2011-09-14 MED ORDER — PROPOFOL 10 MG/ML IV BOLUS
INTRAVENOUS | Status: DC | PRN
  Administered 2011-09-14: 150 mg via INTRAVENOUS
  Administered 2011-09-14: 50 mg via INTRAVENOUS

## 2011-09-14 MED ORDER — FENTANYL CITRATE 0.05 MG/ML IJ SOLN
25.0000 ug | INTRAMUSCULAR | Status: DC | PRN
  Administered 2011-09-14 (×2): 50 ug via INTRAVENOUS

## 2011-09-14 MED ORDER — ONDANSETRON HCL 4 MG/2ML IJ SOLN
4.0000 mg | INTRAMUSCULAR | Status: DC | PRN
  Administered 2011-09-14: 4 mg via INTRAVENOUS
  Filled 2011-09-14: qty 2

## 2011-09-14 MED ORDER — MIDAZOLAM HCL 2 MG/2ML IJ SOLN
INTRAMUSCULAR | Status: AC
  Administered 2011-09-14: 1 mg via INTRAVENOUS
  Filled 2011-09-14: qty 2

## 2011-09-14 MED ORDER — DEXTROSE-NACL 5-0.45 % IV SOLN
INTRAVENOUS | Status: DC
  Administered 2011-09-14 – 2011-09-15 (×2): via INTRAVENOUS

## 2011-09-14 MED ORDER — MIDAZOLAM HCL 2 MG/2ML IJ SOLN
1.0000 mg | INTRAMUSCULAR | Status: DC | PRN
  Administered 2011-09-14: 2 mg via INTRAVENOUS
  Administered 2011-09-14: 1 mg via INTRAVENOUS

## 2011-09-14 SURGICAL SUPPLY — 20 items
BANDAGE CONFORM 2  STR LF (GAUZE/BANDAGES/DRESSINGS) ×4 IMPLANT
CLOTH BEACON ORANGE TIMEOUT ST (SAFETY) ×2 IMPLANT
COVER SURGICAL LIGHT HANDLE (MISCELLANEOUS) ×4 IMPLANT
ELECT REM PT RETURN 9FT ADLT (ELECTROSURGICAL) ×2
ELECTRODE REM PT RTRN 9FT ADLT (ELECTROSURGICAL) ×1 IMPLANT
GAUZE PACKING IODOFORM 1/2 (PACKING) ×4 IMPLANT
GLOVE BIOGEL M 7.0 STRL (GLOVE) ×2 IMPLANT
GLOVE SS BIOGEL STRL SZ 6.5 (GLOVE) ×1 IMPLANT
GLOVE SUPERSENSE BIOGEL SZ 6.5 (GLOVE) ×1
GOWN STRL REIN XL XLG (GOWN DISPOSABLE) ×4 IMPLANT
KIT ROOM TURNOVER AP CYSTO (KITS) ×2 IMPLANT
MANIFOLD NEPTUNE II (INSTRUMENTS) ×2 IMPLANT
NS IRRIG 1000ML POUR BTL (IV SOLUTION) ×2 IMPLANT
PACK MINOR (CUSTOM PROCEDURE TRAY) ×2 IMPLANT
PAD ARMBOARD 7.5X6 YLW CONV (MISCELLANEOUS) ×2 IMPLANT
SET BASIN LINEN APH (SET/KITS/TRAYS/PACK) ×2 IMPLANT
SPONGE GAUZE 4X4 12PLY (GAUZE/BANDAGES/DRESSINGS) ×4 IMPLANT
SWAB CULTURE LIQ STUART DBL (MISCELLANEOUS) ×2 IMPLANT
TOWEL OR 17X26 4PK STRL BLUE (TOWEL DISPOSABLE) ×2 IMPLANT
TUBE ANAEROBIC PORT A CUL  W/M (MISCELLANEOUS) ×2 IMPLANT

## 2011-09-14 NOTE — Transfer of Care (Signed)
Immediate Anesthesia Transfer of Care Note  Patient: Christian Rose  Procedure(s) Performed: Procedure(s) (LRB): IRRIGATION AND DEBRIDEMENT ABSCESS (N/A)  Patient Location: PACU  Anesthesia Type: General  Level of Consciousness: awake, alert  and oriented  Airway & Oxygen Therapy: Patient Spontanous Breathing and Patient connected to face mask oxygen  Post-op Assessment: Report given to PACU RN  Post vital signs: Reviewed and stable  Complications: No apparent anesthesia complications

## 2011-09-14 NOTE — Brief Op Note (Signed)
09/11/2011 - 09/14/2011  1:15 PM  PATIENT:  Christian Rose  21 y.o. male  PRE-OPERATIVE DIAGNOSIS:  abscess to penis  POST-OPERATIVE DIAGNOSIS:  abscess to penis  PROCEDURE:  Procedure(s) (LRB): IRRIGATION AND DEBRIDEMENT ABSCESS (N/A)  SURGEON:  Surgeon(s) and Role:    * Ky Barban, MD - Primary  PHYSICIAN ASSISTANT:   ASSISTANTS: none   ANESTHESIA:   general  EBL:  Total I/O In: 200 [I.V.:200] Out: 15 [Blood:15]  BLOOD ADMINISTERED:none  DRAINS: none   LOCAL MEDICATIONS USED:  NONE  SPECIMEN:  Source of Specimen:  Culture taken from the wound  DISPOSITION OF SPECIMEN:  N/A  COUNTS:  YES  TOURNIQUET:  * No tourniquets in log *  DICTATION: .Other Dictation: Dictation Number dictation#[id number]  PLAN OF CARE: Discharge to home after PACU  PATIENT DISPOSITION:  PACU - hemodynamically stable.   Delay start of Pharmacological VTE agent (>24hrs) due to surgical blood loss or risk of bleeding:

## 2011-09-14 NOTE — Anesthesia Preprocedure Evaluation (Signed)
Anesthesia Evaluation  Patient identified by MRN, date of birth, ID band Patient awake    Reviewed: Allergy & Precautions, H&P , NPO status , Patient's Chart, lab work & pertinent test results  History of Anesthesia Complications Negative for: history of anesthetic complications  Airway Mallampati: II      Dental  (+) Teeth Intact   Pulmonary neg pulmonary ROS, Current Smoker,  breath sounds clear to auscultation        Cardiovascular negative cardio ROS  Rhythm:Regular     Neuro/Psych    GI/Hepatic   Endo/Other    Renal/GU      Musculoskeletal   Abdominal   Peds  Hematology   Anesthesia Other Findings   Reproductive/Obstetrics                           Anesthesia Physical Anesthesia Plan  ASA: I  Anesthesia Plan: General   Post-op Pain Management:    Induction: Intravenous  Airway Management Planned: LMA  Additional Equipment:   Intra-op Plan:   Post-operative Plan: Extubation in OR  Informed Consent: I have reviewed the patients History and Physical, chart, labs and discussed the procedure including the risks, benefits and alternatives for the proposed anesthesia with the patient or authorized representative who has indicated his/her understanding and acceptance.     Plan Discussed with:   Anesthesia Plan Comments:         Anesthesia Quick Evaluation

## 2011-09-14 NOTE — Progress Notes (Signed)
Subjective  The patient recently got back from surgery. He still feels a little groggy. He believes that the pain in his penis is beginning to set in. No nausea or vomiting.    Objective: Vital signs in last 24 hours: Filed Vitals:   09/14/11 1408 09/14/11 1426 09/14/11 1505 09/14/11 1540  BP:  135/73 111/59 105/66  Pulse: 88 56 50 53  Temp:  97 F (36.1 C) 98 F (36.7 C) 98 F (36.7 C)  TempSrc:      Resp: 17 16 16 16   Height:      Weight:      SpO2: 100% 95% 94% 98%   Weight change:   Intake/Output Summary (Last 24 hours) at 09/14/11 1626 Last data filed at 09/14/11 1600  Gross per 24 hour  Intake   1040 ml  Output   1015 ml  Net     25 ml   Physical Exam:  Gen nontoxic Lungs clear to auscultation bilaterally without wheeze rhonchi or rales Cardiovascular regular rate rhythm without murmurs gallops rubs Abdomen soft nontender nondistended Extremities no clubbing cyanosis or edema GU: post surgical dressing is in place, not removed.  Lab Results: Basic Metabolic Panel:  Lab 09/11/11 0454  NA 137  K 3.9  CL 100  CO2 24  GLUCOSE 82  BUN 15  CREATININE 1.27  CALCIUM 10.1  MG --  PHOS --   Liver Function Tests:  Lab 09/11/11 1833  AST 21  ALT 16  ALKPHOS 87  BILITOT 0.5  PROT 7.8  ALBUMIN 4.1   No results found for this basename: LIPASE:2,AMYLASE:2 in the last 168 hours No results found for this basename: AMMONIA:2 in the last 168 hours CBC:  Lab 09/12/11 0535 09/11/11 1833  WBC 12.2* 17.9*  NEUTROABS -- 13.3*  HGB 13.5 16.0  HCT 40.9 46.2  MCV 88.0 86.7  PLT 132* 150   Cardiac Enzymes: No results found for this basename: CKTOTAL:3,CKMB:3,CKMBINDEX:3,TROPONINI:3 in the last 168 hours BNP: No results found for this basename: PROBNP:3 in the last 168 hours D-Dimer: No results found for this basename: DDIMER:2 in the last 168 hours CBG:  Lab 09/12/11 2057  GLUCAP 112*   Hemoglobin A1C: No results found for this basename: HGBA1C in  the last 168 hours Fasting Lipid Panel: No results found for this basename: CHOL,HDL,LDLCALC,TRIG,CHOLHDL,LDLDIRECT in the last 098 hours Thyroid Function Tests: No results found for this basename: TSH,T4TOTAL,FREET4,T3FREE,THYROIDAB in the last 168 hours Coagulation: No results found for this basename: LABPROT:4,INR:4 in the last 168 hours Anemia Panel: No results found for this basename: VITAMINB12,FOLATE,FERRITIN,TIBC,IRON,RETICCTPCT in the last 168 hours Urine Drug Screen: Drugs of Abuse  No results found for this basename: labopia,  cocainscrnur,  labbenz,  amphetmu,  thcu,  labbarb    Alcohol Level: No results found for this basename: ETH:2 in the last 168 hours Urinalysis:  Lab 09/11/11 1849  COLORURINE YELLOW  LABSPEC >1.030*  PHURINE 6.0  GLUCOSEU NEGATIVE  HGBUR NEGATIVE  BILIRUBINUR NEGATIVE  KETONESUR NEGATIVE  PROTEINUR NEGATIVE  UROBILINOGEN 0.2  NITRITE NEGATIVE  LEUKOCYTESUR NEGATIVE   Micro Results: Recent Results (from the past 240 hour(s))  SURGICAL PCR SCREEN     Status: Normal   Collection Time   09/14/11  7:46 AM      Component Value Range Status Comment   [name], PCR NEGATIVE  NEGATIVE  Final    Staphylococcus aureus NEGATIVE  NEGATIVE  Final    Studies/Results: No results found.  Scheduled Meds:    .  lidocaine      . midazolam      . propofol      . sodium chloride      . sodium chloride      . vancomycin  1,000 mg Intravenous Q8H  . DISCONTD: enoxaparin  40 mg Subcutaneous Daily  . DISCONTD: vancomycin  1,000 mg Intravenous Q8H  . DISCONTD: vancomycin  1,000 mg Intravenous Q24H   Continuous Infusions:    . dextrose 5 % and 0.45% NaCl 100 mL/hr at 09/14/11 1442  . DISCONTD: 0.9 % NaCl with KCl 20 mEq / L 20 mL/hr at 09/12/11 1410  . DISCONTD: lactated ringers 1,000 mL (09/14/11 1134)   PRN Meds:.oxyCODONE-acetaminophen, DISCONTD: acetaminophen, DISCONTD: acetaminophen, DISCONTD: fentaNYL, DISCONTD: HYDROmorphone, DISCONTD: midazolam,  DISCONTD: nicotine, DISCONTD: ondansetron (ZOFRAN) IV, DISCONTD: oxyCODONE, DISCONTD: traZODone Assessment/Plan:  Active Problems:  Cellulitis and abscess of the base of the penis. STD workup negative. Now status post irrigation and debridement by Dr. Jerre Simon today. Specimens were sent for culturing. Continue vancomycin. Continue analgesics for pain as needed.   Tobacco abuse. Tobacco cessation counseling was ordered.  Thrombocytopenia. This may be secondary to antibiotics. His platelet count will be followed. We'll check followup laboratory studies in the morning.   LOS: 3 days   Christian Rose 09/14/2011, 4:26 PM

## 2011-09-14 NOTE — Anesthesia Postprocedure Evaluation (Signed)
  Anesthesia Post-op Note  Patient: Christian Rose  Procedure(s) Performed: Procedure(s) (LRB): IRRIGATION AND DEBRIDEMENT ABSCESS (N/A)  Patient Location: PACU  Anesthesia Type: General  Level of Consciousness: awake, alert  and oriented  Airway and Oxygen Therapy: Patient Spontanous Breathing  Post-op Pain: mild  Post-op Assessment: Post-op Vital signs reviewed, Patient's Cardiovascular Status Stable, Respiratory Function Stable, Patent Airway and No signs of Nausea or vomiting  Post-op Vital Signs: Reviewed and stable  Complications: No apparent anesthesia complications

## 2011-09-14 NOTE — Addendum Note (Signed)
Addendum  created 09/14/11 1338 by Moshe Salisbury, CRNA   Modules edited:Anesthesia Medication Administration

## 2011-09-14 NOTE — Progress Notes (Signed)
ANTIBIOTIC CONSULT NOTE   Pharmacy Consult for Vancomycin Indication: Cellulitis and abscess at base of penis  No Known Allergies  Patient Measurements: Height: 5\' 9"  (175.3 cm) Weight: 181 lb 10.5 oz (82.4 kg) IBW/kg (Calculated) : 70.7   Vital Signs: Temp: 98 F (36.7 C) (04/09 0622) Temp src: Oral (04/09 0622) BP: 118/73 mmHg (04/09 0622) Pulse Rate: 58  (04/09 0622) Intake/Output from previous day: 04/08 0701 - 04/09 0700 In: 880 [P.O.:880] Out: 1050 [Urine:1050] Intake/Output from this shift:    Labs:  Basename 09/12/11 0535 09/11/11 1833  WBC 12.2* 17.9*  HGB 13.5 16.0  PLT 132* 150  LABCREA -- --  CREATININE -- 1.27   Estimated Creatinine Clearance: 91.2 ml/min (by C-G formula based on Cr of 1.27).  Basename 09/13/11 0837  VANCOTROUGH <5.0*  VANCOPEAK --  Drue Dun --  GENTTROUGH --  GENTPEAK --  GENTRANDOM --  TOBRATROUGH --  TOBRAPEAK --  TOBRARND --  AMIKACINPEAK --  AMIKACINTROU --  AMIKACIN --     Microbiology: Recent Results (from the past 720 hour(s))  SURGICAL PCR SCREEN     Status: Normal   Collection Time   09/14/11  7:46 AM      Component Value Range Status Comment   [name], PCR NEGATIVE  NEGATIVE  Final    Staphylococcus aureus NEGATIVE  NEGATIVE  Final    Medical History: History reviewed. No pertinent past medical history.  Medications:  Scheduled:     . enoxaparin  40 mg Subcutaneous Daily  . sodium chloride      . sodium chloride      . vancomycin  1,000 mg Intravenous Q8H  . DISCONTD: vancomycin  1,000 mg Intravenous Q12H   Assessment: Ok for protocol Excellent Vancomycin clearance  Goal of Therapy:  Vancomycin trough level 10-15  Plan:  Continue Vancomycin 1 GM IV every 8 hours Check trough tomorrow  Valrie Hart A 09/14/2011,10:01 AM

## 2011-09-14 NOTE — Progress Notes (Signed)
NAME:  Christian Rose, Christian Rose NO.:  [id number]  MEDICAL RECORD NO.:  [id number]  LOCATION:  A305                          FACILITY:  [facility]  PHYSICIAN:  Ky Barban, M.D.DATE OF BIRTH:  1991-02-23  DATE OF PROCEDURE: DATE OF DISCHARGE:                                PROGRESS NOTE   There was more purulent drainage from the small opening in the abscess at base of the penis.  He is feeling better, but on examination, I can feel more fluctuant area around it.  I think he will be better if I do incision and drainage and clean the abscess area from inside.  In the meantime, continue doing IV antibiotics.  I told him we will do it under anesthesia tomorrow morning.  He is agreeable, so I will schedule for tomorrow morning.     Ky Barban, M.D.     MIJ/MEDQ  D:  09/13/2011  T:  09/14/2011  Job:  [id number]

## 2011-09-14 NOTE — Addendum Note (Signed)
Addendum  created 09/14/11 1338 by Shawni Volkov E Brinnley Lacap, CRNA   Modules edited:Anesthesia Medication Administration    

## 2011-09-14 NOTE — Anesthesia Procedure Notes (Addendum)
Procedure Name: LMA Insertion Date/Time: 09/14/2011 12:48 PM Performed by: Glynn Octave E Pre-anesthesia Checklist: Patient identified, Patient being monitored, Emergency Drugs available, Timeout performed and Suction available Patient Re-evaluated:Patient Re-evaluated prior to inductionOxygen Delivery Method: Circle System Utilized Preoxygenation: Pre-oxygenation with 100% oxygen Intubation Type: IV induction Ventilation: Mask ventilation without difficulty LMA: LMA inserted LMA Size: 4.0 Number of attempts: 1 Placement Confirmation: positive ETCO2 and breath sounds checked- equal and bilateral

## 2011-09-15 ENCOUNTER — Encounter (HOSPITAL_COMMUNITY): Payer: Self-pay | Admitting: Internal Medicine

## 2011-09-15 DIAGNOSIS — R001 Bradycardia, unspecified: Secondary | ICD-10-CM | POA: Diagnosis present

## 2011-09-15 HISTORY — DX: Bradycardia, unspecified: R00.1

## 2011-09-15 LAB — CBC
HCT: 43 % (ref 39.0–52.0)
MCH: 29 pg (ref 26.0–34.0)
MCHC: 33.5 g/dL (ref 30.0–36.0)
MCV: 86.7 fL (ref 78.0–100.0)
Platelets: 192 10*3/uL (ref 150–400)
RDW: 13.7 % (ref 11.5–15.5)

## 2011-09-15 LAB — COMPREHENSIVE METABOLIC PANEL
AST: 28 U/L (ref 0–37)
Albumin: 3.3 g/dL — ABNORMAL LOW (ref 3.5–5.2)
BUN: 11 mg/dL (ref 6–23)
Calcium: 9.7 mg/dL (ref 8.4–10.5)
Creatinine, Ser: 0.98 mg/dL (ref 0.50–1.35)

## 2011-09-15 MED ORDER — OXYCODONE-ACETAMINOPHEN 5-325 MG PO TABS: 1.0000 | ORAL_TABLET | ORAL | Status: AC | PRN

## 2011-09-15 MED ORDER — DOXYCYCLINE HYCLATE 100 MG PO TABS: 100.0000 mg | ORAL_TABLET | Freq: Two times a day (BID) | ORAL | Status: AC

## 2011-09-15 NOTE — Progress Notes (Signed)
UR Chart Review Completed  

## 2011-09-15 NOTE — Progress Notes (Signed)
   CARE MANAGEMENT NOTE 09/15/2011  Patient:  Christian Rose, Christian Rose   Account Number:  [id number]  Date Initiated:  09/15/2011  Documentation initiated by:  Rosemary Holms  Subjective/Objective Assessment:   Pt admitted with an abscess on the base of penis. PTA lived with parents. No PCP     Action/Plan:   Pt to discharge home. Declined HH RN for dressing change.   Anticipated DC Date:  09/15/2011   Anticipated DC Plan:  HOME/SELF CARE      DC Planning Services  CM consult      Choice offered to / List presented to:             Status of service:  Completed, signed off Medicare Important Message given?   (If response is "NO", the following Medicare IM given date fields will be blank) Date Medicare IM given:   Date Additional Medicare IM given:    Discharge Disposition:  HOME/SELF CARE  Per UR Regulation:    If discussed at Long Length of Stay Meetings, dates discussed:    Comments:  09/15/11 1400 Silver Parkey Leanord Hawking RN BSN CM

## 2011-09-15 NOTE — Progress Notes (Signed)
ANTIBIOTIC CONSULT NOTE   Pharmacy Consult for Vancomycin Indication: Cellulitis and abscess at base of penis  No Known Allergies  Patient Measurements: Height: 5\' 9"  (175.3 cm) Weight: 181 lb 10.5 oz (82.4 kg) IBW/kg (Calculated) : 70.7   Vital Signs: Temp: 97.7 F (36.5 C) (04/10 0508) Temp src: Oral (04/10 0508) BP: 101/57 mmHg (04/10 0508) Pulse Rate: 51  (04/10 0508) Intake/Output from previous day: 04/09 0701 - 04/10 0700 In: 1280 [P.O.:730; I.V.:550] Out: 1465 [Urine:1450; Blood:15] Intake/Output from this shift: Total I/O In: 200 [P.O.:200] Out: 650 [Urine:650]  Labs:  [facility] 09/15/11 0434  WBC 6.4  HGB 14.4  PLT 192  LABCREA --  CREATININE 0.98   Estimated Creatinine Clearance: 118.2 ml/min (by C-G formula based on Cr of 0.98).  Basename 09/13/11 0837  VANCOTROUGH <5.0*  VANCOPEAK --  Drue Dun --  Janeece Agee --  GENTPEAK --  GENTRANDOM --  TOBRATROUGH --  TOBRAPEAK --  TOBRARND --  AMIKACINPEAK --  AMIKACINTROU --  AMIKACIN --    Microbiology: Recent Results (from the past 720 hour(s))  SURGICAL PCR SCREEN     Status: Normal   Collection Time   09/14/11  7:46 AM      Component Value Range Status Comment   [name], PCR NEGATIVE  NEGATIVE  Final    Staphylococcus aureus NEGATIVE  NEGATIVE  Final   CULTURE, ROUTINE-ABSCESS     Status: Normal (Preliminary result)   Collection Time   09/14/11  1:10 PM      Component Value Range Status Comment   Specimen Description ABSCESS PENIS   Final    Special Requests NONE   Final    Gram Stain     Final    Value: FEW WBC PRESENT, PREDOMINANTLY PMN     NO SQUAMOUS EPITHELIAL CELLS SEEN     NO ORGANISMS SEEN   Culture NO GROWTH   Final    Report Status PENDING   Incomplete   ANAEROBIC CULTURE     Status: Normal (Preliminary result)   Collection Time   09/14/11  1:10 PM      Component Value Range Status Comment   Specimen Description ABSCESS PENIS   Final    Special Requests NONE   Final    Gram Stain      Final    Value: FEW WBC PRESENT, PREDOMINANTLY PMN     NO SQUAMOUS EPITHELIAL CELLS SEEN     NO ORGANISMS SEEN   Culture     Final    Value: NO ANAEROBES ISOLATED; CULTURE IN PROGRESS FOR 5 DAYS   Report Status PENDING   Incomplete    Medical History: History reviewed. No pertinent past medical history.  Medications:  Scheduled:     . lidocaine      . midazolam      . propofol      . sodium chloride      . vancomycin  1,000 mg Intravenous Q8H  . DISCONTD: enoxaparin  40 mg Subcutaneous Daily  . DISCONTD: vancomycin  1,000 mg Intravenous Q8H  . DISCONTD: vancomycin  1,000 mg Intravenous Q24H   Assessment: Ok for protocol Excellent Vancomycin clearance S/P debridement of abscess  Goal of Therapy:  Vancomycin trough level 10-15  Plan:  Continue Vancomycin 1 GM IV every 8 hours Check trough tomorrow  Margo Aye, Nare Gaspari A 09/15/2011,11:05 AM

## 2011-09-15 NOTE — Progress Notes (Signed)
Assisted with medication for DC. Rosemary Holms D RN BSN CM

## 2011-09-15 NOTE — Progress Notes (Signed)
Patient transported out by this RN. Patient in stable condition.

## 2011-09-15 NOTE — Progress Notes (Signed)
Discharge instructions reviewed with patient, patient voiced understanding. Discharge instructions given to patient as well as prescriptions. Patient instructed to go to [facility] to pick up medications, address given to patient, patient voiced understanding. Patient in stable condition and waiting on ride.

## 2011-09-15 NOTE — Op Note (Signed)
NAMELAEL, PILCH              ACCOUNT NO.:  [id number]  MEDICAL RECORD NO.:  [id number]  LOCATION:  A305                          FACILITY:  [facility]  PHYSICIAN:  Ky Barban, M.D.DATE OF BIRTH:  [birth year]  DATE OF PROCEDURE: DATE OF DISCHARGE:                              OPERATIVE REPORT   PREOPERATIVE DIAGNOSIS:  Penile abscess.  POSTOPERATIVE DIAGNOSIS:  Penile abscess.  PROCEDURE:  Incision and drainage of penile abscess.  ANESTHESIA:  General.  DESCRIPTION OF PROCEDURE:  The patient, under general endotracheal anesthesia, in supine position after usual prep and drape.  He has a sinus in the base of the penis, through which pus was coming out.  There was an abscess around it.  I introduced a hemostat.  It was quite deep abscess.  More pus came out on squeezing and I  made about 0.5-inch long incision in the skin, opened up, and then took some cultures, both aerobic and anaerobic cultures.  I irrigated the wound with saline and cleaned out all the purulent material which was coming out, then packed the wound with iodoform 1 inch, dressed the wound with 4x4 and Kling. The patient left the operating room in satisfactory condition.     Ky Barban, M.D.     MIJ/MEDQ  D:  09/14/2011  T:  09/15/2011  Job:  [id number]

## 2011-09-15 NOTE — Discharge Summary (Signed)
Physician Discharge Summary  MALIKAI GUT MRN: [id number] DOB/AGE: Apr 28, 1991 20 y.o.  PCP: No primary provider on file.   Admit date: 09/11/2011 Discharge date: 09/15/2011  Discharge Diagnoses:  1. Cellulitis and abscess at the base of the penis. Status post incision and drainage by Dr. Jerre Simon on September 14, 2011 Final culture results pending at the time of discharge. STD screen was negative for syphilis, gonorrhea, and chlamydia. 2. Tobacco abuse. He was advised against it. 3. Mild asymptomatic bradycardia in a well conditioned male. 4. Transient thrombocytopenia. Resolved.   Medication List  As of 09/15/2011  3:27 PM   TAKE these medications         doxycycline 100 MG tablet   Commonly known as: VIBRA-TABS   Take 1 tablet (100 mg total) by mouth 2 (two) times daily. Antibiotic to be taken for 4 more days.      oxyCODONE-acetaminophen 5-325 MG per tablet   Commonly known as: PERCOCET   Take 1-2 tablets by mouth every 4 (four) hours as needed.            Discharge Condition: Improved and stable.  Disposition: 01-Home or Self Care   Consults: Urologist, Dr. Jerre Simon.   Microbiology: Recent Results (from the past 240 hour(s))  SURGICAL PCR SCREEN     Status: Normal   Collection Time   09/14/11  7:46 AM      Component Value Range Status Comment   [name], PCR NEGATIVE  NEGATIVE  Final    Staphylococcus aureus NEGATIVE  NEGATIVE  Final   CULTURE, ROUTINE-ABSCESS     Status: Normal (Preliminary result)   Collection Time   09/14/11  1:10 PM      Component Value Range Status Comment   Specimen Description ABSCESS PENIS   Final    Special Requests NONE   Final    Gram Stain     Final    Value: FEW WBC PRESENT, PREDOMINANTLY PMN     NO SQUAMOUS EPITHELIAL CELLS SEEN     NO ORGANISMS SEEN   Culture NO GROWTH   Final    Report Status PENDING   Incomplete   ANAEROBIC CULTURE     Status: Normal (Preliminary result)   Collection Time   09/14/11  1:10 PM      Component Value  Range Status Comment   Specimen Description ABSCESS PENIS   Final    Special Requests NONE   Final    Gram Stain     Final    Value: FEW WBC PRESENT, PREDOMINANTLY PMN     NO SQUAMOUS EPITHELIAL CELLS SEEN     NO ORGANISMS SEEN   Culture     Final    Value: NO ANAEROBES ISOLATED; CULTURE IN PROGRESS FOR 5 DAYS   Report Status PENDING   Incomplete      Labs: Results for orders placed during the hospital encounter of 09/11/11 (from the past 48 hour(s))  SURGICAL PCR SCREEN     Status: Normal   Collection Time   09/14/11  7:46 AM      Component Value Range Comment   [name], PCR NEGATIVE  NEGATIVE     Staphylococcus aureus NEGATIVE  NEGATIVE    CULTURE, ROUTINE-ABSCESS     Status: Normal (Preliminary result)   Collection Time   09/14/11  1:10 PM      Component Value Range Comment   Specimen Description ABSCESS PENIS      Special Requests NONE  Gram Stain        Value: FEW WBC PRESENT, PREDOMINANTLY PMN     NO SQUAMOUS EPITHELIAL CELLS SEEN     NO ORGANISMS SEEN   Culture NO GROWTH      Report Status PENDING     ANAEROBIC CULTURE     Status: Normal (Preliminary result)   Collection Time   09/14/11  1:10 PM      Component Value Range Comment   Specimen Description ABSCESS PENIS      Special Requests NONE      Gram Stain        Value: FEW WBC PRESENT, PREDOMINANTLY PMN     NO SQUAMOUS EPITHELIAL CELLS SEEN     NO ORGANISMS SEEN   Culture        Value: NO ANAEROBES ISOLATED; CULTURE IN PROGRESS FOR 5 DAYS   Report Status PENDING     CBC     Status: Normal   Collection Time   09/15/11  4:34 AM      Component Value Range Comment   WBC 6.4  4.0 - 10.5 (K/uL)    RBC 4.96  4.22 - 5.81 (MIL/uL)    Hemoglobin 14.4  13.0 - 17.0 (g/dL)    HCT 24.4  01.0 - 27.2 (%)    MCV 86.7  78.0 - 100.0 (fL)    MCH 29.0  26.0 - 34.0 (pg)    MCHC 33.5  30.0 - 36.0 (g/dL)    RDW 53.6  64.4 - 03.4 (%)    Platelets 192  150 - 400 (K/uL)   COMPREHENSIVE METABOLIC PANEL     Status: Abnormal    Collection Time   09/15/11  4:34 AM      Component Value Range Comment   Sodium 139  135 - 145 (mEq/L)    Potassium 3.8  3.5 - 5.1 (mEq/L)    Chloride 101  96 - 112 (mEq/L)    CO2 28  19 - 32 (mEq/L)    Glucose, Bld 91  70 - 99 (mg/dL)    BUN 11  6 - 23 (mg/dL)    Creatinine, Ser 7.42  0.50 - 1.35 (mg/dL)    Calcium 9.7  8.4 - 10.5 (mg/dL)    Total Protein 7.3  6.0 - 8.3 (g/dL)    Albumin 3.3 (*) 3.5 - 5.2 (g/dL)    AST 28  0 - 37 (U/L)    ALT 39  0 - 53 (U/L)    Alkaline Phosphatase 69  39 - 117 (U/L)    Total Bilirubin 0.3  0.3 - 1.2 (mg/dL)    GFR calc non Af Amer >90  >90 (mL/min)    GFR calc Af Amer >90  >90 (mL/min)      HPI : The patient is a 21 year old man with no significant past medical history, who presented to the emergency department on September 11, 2011, with a chief complaint of a boil on his penis which was very painful. In the emergency department, he was noted to be borderline febrile, but otherwise hemodynamically stable. His lab data were significant for a WBC of 17.9 and a relatively unremarkable urinalysis. He was admitted for further evaluation and management.  HOSPITAL COURSE: The patient was started on gentle IV fluids for hydration. IV vancomycin was given once in the emergency department. He was subsequently started on Zosyn and vancomycin. His pain was treated with as needed hydromorphone. Warm compresses were applied to help promote drainage. An STD screen was  ordered. The STD screen was negative for syphilis, Chlamydia, and gonorrhea. After a couple days on IV antibiotics, the abscess appeared to be draining more. Urologist, Dr. Jerre Simon was consulted. Eventually, he decided to take the patient to the emergency department for incision and drainage of the abscess. This was performed on September 14 2011, successfully. Specimens were sent for culturing. Final results were pending at the time of discharge. It was thought that the patient most likely had a Staphylococcus  infection, but this had not been confirmed by the culture yet. Upon reexamination following the operation, the following day, the abscessed area was much less swollen and indurated, but it was still quite tender to touch. The patient was instructed on wound care and keeping the area clean and dry as much as possible. He was discharged home on analgesics and 4 more days of antibiotic therapy with doxycycline. He will follow with Dr. Jerre Simon next week for further evaluation and management.    Discharge Exam: Blood pressure 103/55, pulse 58, temperature 97.9 F (36.6 C), temperature source Oral, resp. rate 18, height 5\' 9"  (1.753 m), weight 82.4 kg (181 lb 10.5 oz), SpO2 97.00%. Lungs: Clear to auscultation bilaterally. Heart: S1, S2, with borderline tachycardia. Abdomen: Positive bowel sounds, soft, nontender, nondistended. GU: The base of the penis has a postsurgical incision without drainage. There is less erythema, induration, and edema. It is still quite tender to palpation.    Discharge Orders    Future Orders Please Complete By Expires   Diet general      Increase activity slowly      Discharge wound care:      Comments:   Clean wound on penis with saline or mildly soapy water daily. Cover with a 4 x 4 dressing. Remove dressing when you shower.      Follow-up Information    Follow up with Ky Barban, MD on 09/20/2011. (AT 10:30 AM)    Contact information:   [address] [place] [address] [phone]          Total discharge time: 30 minutes.   Signed: [place] Severs 09/15/2011, 3:27 PM

## 2011-09-16 ENCOUNTER — Encounter (HOSPITAL_COMMUNITY): Payer: Self-pay | Admitting: Urology

## 2011-09-17 LAB — CULTURE, ROUTINE-ABSCESS

## 2011-09-19 LAB — ANAEROBIC CULTURE

## 2012-01-23 ENCOUNTER — Emergency Department (HOSPITAL_COMMUNITY)
Admission: EM | Admit: 2012-01-23 | Discharge: 2012-01-23 | Disposition: A | Discharge status: Home / Self Care | Payer: Self-pay | Attending: Emergency Medicine | Admitting: Emergency Medicine

## 2012-01-23 ENCOUNTER — Encounter (HOSPITAL_COMMUNITY): Payer: Self-pay | Admitting: Emergency Medicine

## 2012-01-23 DIAGNOSIS — T148XXA Other injury of unspecified body region, initial encounter: Secondary | ICD-10-CM

## 2012-01-23 DIAGNOSIS — S060X0A Concussion without loss of consciousness, initial encounter: Secondary | ICD-10-CM | POA: Insufficient documentation

## 2012-01-23 DIAGNOSIS — F17200 Nicotine dependence, unspecified, uncomplicated: Secondary | ICD-10-CM | POA: Insufficient documentation

## 2012-01-23 DIAGNOSIS — Y9355 Activity, bike riding: Secondary | ICD-10-CM | POA: Insufficient documentation

## 2012-01-23 DIAGNOSIS — S060X9A Concussion with loss of consciousness of unspecified duration, initial encounter: Secondary | ICD-10-CM

## 2012-01-23 DIAGNOSIS — I498 Other specified cardiac arrhythmias: Secondary | ICD-10-CM | POA: Insufficient documentation

## 2012-01-23 DIAGNOSIS — IMO0002 Reserved for concepts with insufficient information to code with codable children: Secondary | ICD-10-CM | POA: Insufficient documentation

## 2012-01-23 DIAGNOSIS — Z818 Family history of other mental and behavioral disorders: Secondary | ICD-10-CM | POA: Insufficient documentation

## 2012-01-23 DIAGNOSIS — Z833 Family history of diabetes mellitus: Secondary | ICD-10-CM | POA: Insufficient documentation

## 2012-01-23 MED ORDER — TETANUS-DIPHTH-ACELL PERTUSSIS 5-2.5-18.5 LF-MCG/0.5 IM SUSP
0.5000 mL | Freq: Once | INTRAMUSCULAR | Status: AC
  Administered 2012-01-23: 0.5 mL via INTRAMUSCULAR
  Filled 2012-01-23: qty 0.5

## 2012-01-23 MED ORDER — IBUPROFEN 800 MG PO TABS: 800.0000 mg | ORAL_TABLET | Freq: Three times a day (TID) | ORAL | Status: DC

## 2012-01-23 MED ORDER — IBUPROFEN 800 MG PO TABS
800.0000 mg | ORAL_TABLET | Freq: Once | ORAL | Status: AC
  Administered 2012-01-23: 800 mg via ORAL
  Filled 2012-01-23: qty 1

## 2012-01-23 MED ORDER — BACITRACIN ZINC 500 UNIT/GM EX OINT
TOPICAL_OINTMENT | Freq: Once | CUTANEOUS | Status: AC
  Administered 2012-01-23: 1 via TOPICAL
  Filled 2012-01-23: qty 0.9
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
## 2012-01-23 NOTE — ED Notes (Signed)
Cleaned wound with normal saline and sterile gauze. Applied bacitracin and bandaid to wound. Patient tolerated well. Gave patient icepack to place over forehead.

## 2012-01-25 ENCOUNTER — Emergency Department (HOSPITAL_COMMUNITY)
Admission: EM | Admit: 2012-01-25 | Discharge: 2012-01-26 | Disposition: A | Discharge status: Home / Self Care | Payer: Self-pay | Attending: Emergency Medicine | Admitting: Emergency Medicine

## 2012-01-25 ENCOUNTER — Encounter (HOSPITAL_COMMUNITY): Payer: Self-pay | Admitting: Emergency Medicine

## 2012-01-25 DIAGNOSIS — T07XXXA Unspecified multiple injuries, initial encounter: Secondary | ICD-10-CM

## 2012-01-25 DIAGNOSIS — W268XXA Contact with other sharp object(s), not elsewhere classified, initial encounter: Secondary | ICD-10-CM | POA: Insufficient documentation

## 2012-01-25 DIAGNOSIS — Z833 Family history of diabetes mellitus: Secondary | ICD-10-CM | POA: Insufficient documentation

## 2012-01-25 DIAGNOSIS — Z818 Family history of other mental and behavioral disorders: Secondary | ICD-10-CM | POA: Insufficient documentation

## 2012-01-25 DIAGNOSIS — T148XXA Other injury of unspecified body region, initial encounter: Secondary | ICD-10-CM | POA: Insufficient documentation

## 2012-01-25 DIAGNOSIS — F17200 Nicotine dependence, unspecified, uncomplicated: Secondary | ICD-10-CM | POA: Insufficient documentation

## 2012-01-25 DIAGNOSIS — F1010 Alcohol abuse, uncomplicated: Secondary | ICD-10-CM | POA: Insufficient documentation

## 2012-01-25 DIAGNOSIS — I498 Other specified cardiac arrhythmias: Secondary | ICD-10-CM | POA: Insufficient documentation
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
## 2012-01-26 MED ORDER — OXYCODONE-ACETAMINOPHEN 5-325 MG PO TABS
1.0000 | ORAL_TABLET | Freq: Once | ORAL | Status: AC
  Administered 2012-01-26: 1 via ORAL

## 2012-01-26 MED ORDER — LIDOCAINE HCL (PF) 1 % IJ SOLN
INTRAMUSCULAR | Status: AC
  Administered 2012-01-26: 03:00:00
  Filled 2012-01-26: qty 5

## 2012-01-26 MED ORDER — HYDROCODONE-ACETAMINOPHEN 5-325 MG PO TABS: 1.0000 | ORAL_TABLET | ORAL | Status: AC | PRN

## 2012-01-26 MED ORDER — BACITRACIN ZINC 500 UNIT/GM EX OINT
TOPICAL_OINTMENT | CUTANEOUS | Status: AC
  Administered 2012-01-26: 03:00:00
  Filled 2012-01-26: qty 4.5

## 2012-01-26 MED ORDER — OXYCODONE-ACETAMINOPHEN 5-325 MG PO TABS
ORAL_TABLET | ORAL | Status: AC
  Administered 2012-01-26: 1 via ORAL
  Filled 2012-01-26: qty 1
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
## 2012-01-28 ENCOUNTER — Emergency Department (HOSPITAL_COMMUNITY)
Admission: EM | Admit: 2012-01-28 | Discharge: 2012-01-28 | Disposition: A | Discharge status: Home / Self Care | Payer: Self-pay | Attending: Emergency Medicine | Admitting: Emergency Medicine

## 2012-01-28 ENCOUNTER — Encounter (HOSPITAL_COMMUNITY): Payer: Self-pay | Admitting: *Deleted

## 2012-01-28 DIAGNOSIS — Z818 Family history of other mental and behavioral disorders: Secondary | ICD-10-CM | POA: Insufficient documentation

## 2012-01-28 DIAGNOSIS — T50905A Adverse effect of unspecified drugs, medicaments and biological substances, initial encounter: Secondary | ICD-10-CM

## 2012-01-28 DIAGNOSIS — F17200 Nicotine dependence, unspecified, uncomplicated: Secondary | ICD-10-CM | POA: Insufficient documentation

## 2012-01-28 DIAGNOSIS — R110 Nausea: Secondary | ICD-10-CM | POA: Insufficient documentation

## 2012-01-28 DIAGNOSIS — T40605A Adverse effect of unspecified narcotics, initial encounter: Secondary | ICD-10-CM | POA: Insufficient documentation

## 2012-01-28 DIAGNOSIS — Z833 Family history of diabetes mellitus: Secondary | ICD-10-CM | POA: Insufficient documentation

## 2012-01-28 MED ORDER — PROMETHAZINE HCL 12.5 MG PO TABS
25.0000 mg | ORAL_TABLET | Freq: Once | ORAL | Status: DC
  Filled 2012-01-28: qty 2

## 2012-01-28 MED ORDER — PROMETHAZINE HCL 25 MG PO TABS: 25.0000 mg | ORAL_TABLET | Freq: Four times a day (QID) | ORAL | Status: DC | PRN

## 2012-01-28 NOTE — ED Notes (Addendum)
Pt states that pain meds make him nauseated an hour taking, denies vomiting

## 2012-01-28 NOTE — ED Provider Notes (Signed)
History     CSN: [id number]  Arrival date & time 01/28/12  Christian Rose   First MD Initiated Contact with Patient 01/28/12 1943      Chief Complaint  Patient presents with  . Nausea    (Consider location/radiation/quality/duration/timing/severity/associated sxs/prior treatment) HPI Comments: Patient was seen in the emergency department for lacerations to both arms approximately 48-72 hours ago. The patient states the pain medication (hydrocodone) causes him to have nausea and" car sickness". He states that this lasts for up to one to 2 hours after he takes the medication. He presents at this time for evaluation of his lacerations, and for medication to help with his nausea.  The history is provided by the patient.    Past Medical History  Diagnosis Date  . 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 Penile abscess     Past Surgical History  Procedure Date  . Hand surgery   . Irrigation and debridement abscess 09/14/2011    Procedure: IRRIGATION AND DEBRIDEMENT ABSCESS;  Surgeon: Ky Barban, MD;  Location: [facility];  Service: Urology;  Laterality: N/A;  Incision and drainage of penile abscess    Family History  Problem Relation Age of Onset  . Diabetes Mother   . Schizophrenia Father     History  Substance Use Topics  . Smoking status: Current Everyday Smoker -- 0.5 packs/day for 8 years    Types: Cigarettes  . Smokeless tobacco: Not on file  . Alcohol Use: 4.0 oz/week    8 drink(s) per week     occ      Review of Systems  Constitutional: Negative for activity change.       All ROS Neg except as noted in HPI  HENT: Negative for nosebleeds and neck pain.   Eyes: Negative for photophobia and discharge.  Respiratory: Negative for cough, shortness of breath and wheezing.   Cardiovascular: Negative for chest pain and palpitations.  Gastrointestinal: Negative for abdominal pain and blood in stool.  Genitourinary: Negative for dysuria, frequency and hematuria.  Musculoskeletal: Negative  for back pain and arthralgias.  Skin: Negative.   Neurological: Negative for dizziness, seizures and speech difficulty.  Psychiatric/Behavioral: Negative for hallucinations and confusion.    Allergies  Review of patient's allergies indicates no known allergies.  Home Medications   Current Outpatient Rx  Name Route Sig Dispense Refill  . DIPHENHYDRAMINE-APAP (SLEEP) 25-500 MG PO TABS Oral Take 1 tablet by mouth at bedtime as needed.    Marland Kitchen HYDROCODONE-ACETAMINOPHEN 5-325 MG PO TABS Oral Take 1 tablet by mouth every 4 (four) hours as needed for pain. 15 tablet 0  . PROMETHAZINE HCL 25 MG PO TABS Oral Take 1 tablet (25 mg total) by mouth every 6 (six) hours as needed for nausea. 12 tablet 0    BP 133/87  Pulse 92  Temp 98.6 F (37 C) (Oral)  Resp 18  Ht 5\' 9"  (1.753 m)  Wt 180 lb (81.647 kg)  BMI 26.58 kg/m2  SpO2 100%  Physical Exam  Nursing note and vitals reviewed. Constitutional: He is oriented to person, place, and time. He appears well-developed and well-nourished.  Non-toxic appearance.  HENT:  Head: Normocephalic.  Right Ear: Tympanic membrane and external ear normal.  Left Ear: Tympanic membrane and external ear normal.  Eyes: EOM and lids are normal. Pupils are equal, round, and reactive to light.  Neck: Normal range of motion. Neck supple. Carotid bruit is not present.  Cardiovascular: Normal rate, regular rhythm, normal heart sounds, intact distal pulses and  normal pulses.   Pulmonary/Chest: Breath sounds normal. No respiratory distress.  Abdominal: Soft. Bowel sounds are normal. There is no tenderness. There is no guarding.       The abdomen is soft with good bowel sounds there is no mass appreciated. There is no rebound tenderness appreciated at this time. There is no distention.  Musculoskeletal: Normal range of motion.       The stapled lacerations of the right forearm and left forearm and the sutured laceration of the left hand are intact and progressing nicely.  There is no drainage or red streaking appreciated. There is full range of motion of both arms and the fingers of the left hand.  Lymphadenopathy:       Head (right side): No submandibular adenopathy present.       Head (left side): No submandibular adenopathy present.    He has no cervical adenopathy.  Neurological: He is alert and oriented to person, place, and time. He has normal strength. No cranial nerve deficit or sensory deficit.  Skin: Skin is warm and dry.  Psychiatric: He has a normal mood and affect. His speech is normal.    ED Course  Procedures (including critical care time)  Labs Reviewed - No data to display No results found.   1. Idiosyncratic reaction to medication after proper dose       MDM  I have reviewed nursing notes, vital signs, and all appropriate lab and imaging results for this patient. Patient states he is having an adverse reaction to the hydrocodone that he was given. The examination reveals stable vital signs. The examination of other reveals no significant changes on abdominal films. There is no signs of major infection from the wounds that were repaired in the emergency department a few days ago. The patient is advised to stop the hydrocodone for now, and to use Phenergan every 6 hours for nausea if needed. He was warned that this medication may cause drowsiness.       Kathie Dike, [place] 01/28/12 2008

## 2012-01-28 NOTE — ED Provider Notes (Signed)
   I was asked by EDP to repair lacerations to this patient's right forearm, left forearm and left hand.  This was my only involvement in this patient's care.   LACERATION REPAIR Performed by: Harrington Jobe L. Authorized by: Maxwell Caul Consent: Verbal consent obtained. Risks and benefits: risks, benefits and alternatives were discussed Consent given by: patient Patient identity confirmed: provided demographic data Prepped and Draped in normal sterile fashion Wound explored  Laceration Location: right forearm Laceration Length: 4 cm  No Foreign Bodies seen or palpated  Anesthesia: local infiltration  Local anesthetic: lidocaine 2% w/o epinephrine  Anesthetic total: 2 ml  Irrigation method: syringe Amount of cleaning: standard  Skin closure: One  5-0 vicryl , staples  Number of sutures: one subcuticular suture  Technique: 6 staples for closure of the skin  Patient tolerance: Patient tolerated the procedure well with no immediate complications.   LACERATION REPAIR Performed by: Heavyn Yearsley L. Authorized by: Maxwell Caul Consent: Verbal consent obtained. Risks and benefits: risks, benefits and alternatives were discussed Consent given by: patient Patient identity confirmed: provided demographic data Prepped and Draped in normal sterile fashion Wound explored  Laceration Location: left forearm Laceration Length: 10 cm  No Foreign Bodies seen or palpated  Anesthesia: local infiltration  Local anesthetic: lidocaine 2 % w/o epinephrine  Anesthetic total: 3 ml  Irrigation method: syringe Amount of cleaning: standard  Skin closure: five subcuticular sutures, staples  Number of sutures: 5 White sutures, 13 staples  Patient tolerance: Patient tolerated the procedure well with no immediate complications.      LACERATION REPAIR Performed by: Merrick Maggio L. Authorized by: Maxwell Caul Consent: Verbal consent obtained. Risks and benefits:  risks, benefits and alternatives were discussed Consent given by: patient Patient identity confirmed: provided demographic data Prepped and Draped in normal sterile fashion Wound explored  Laceration Location: left proximal index finger Laceration Length: 4 cm  No Foreign Bodies seen or palpated  Anesthesia: local infiltration  Local anesthetic: lidocaine 1% w/o epinephrine  Anesthetic total: 2 ml  Irrigation method: syringe Amount of cleaning: standard  Skin closure: 4-0 prolene Number of sutures: 7 Technique: simple interrupted  Patient tolerance: Patient tolerated the procedure well with no immediate complications.    Wound(s) explored with adequate hemostasis through ROM, no apparent gross foreign body retained, no significant involvement of deep structures such as bone / joint / tendon / or neurovascular involvement noted.  Baseline Strength and Sensation to affected extremity(ies) with normal light touch for Pt, distal NVI with CR< 2 secs and pulse(s) intact to affected extremity(ies).    Mccartney Brucks L. [place], [place] 01/28/12 2051

## 2012-01-28 NOTE — ED Notes (Addendum)
Seen here 8/20 for lacerations to arms and lt hand., staples in place,  Pt says pain med does not help but makes him nauseated.

## 2012-01-30 NOTE — ED Provider Notes (Signed)
Medical screening examination/treatment/procedure(s) were performed by non-physician practitioner and as supervising physician I was immediately available for consultation/collaboration.   Ching Rabideau W. Ginnifer Creelman, MD 01/30/12 1313 

## 2012-02-03 ENCOUNTER — Emergency Department (HOSPITAL_COMMUNITY)
Admission: EM | Admit: 2012-02-03 | Discharge: 2012-02-03 | Disposition: A | Discharge status: Home / Self Care | Payer: Self-pay | Attending: Emergency Medicine | Admitting: Emergency Medicine

## 2012-02-03 ENCOUNTER — Encounter (HOSPITAL_COMMUNITY): Payer: Self-pay | Admitting: Emergency Medicine

## 2012-02-03 DIAGNOSIS — Z4802 Encounter for removal of sutures: Secondary | ICD-10-CM | POA: Insufficient documentation

## 2012-02-03 DIAGNOSIS — F17200 Nicotine dependence, unspecified, uncomplicated: Secondary | ICD-10-CM | POA: Insufficient documentation

## 2012-02-03 NOTE — ED Notes (Signed)
Patient with no complaints at this time. Respirations even and unlabored. Skin warm/dry. Discharge instructions reviewed with patient at this time. Patient given opportunity to voice concerns/ask questions. Patient discharged at this time and left Emergency Department with steady gait.   

## 2012-02-03 NOTE — ED Notes (Signed)
Patient states needs staples and sutures removed. Reports having staples and sutures in place for 9 days.

## 2012-02-04 NOTE — ED Provider Notes (Signed)
History     CSN: [id number]  Arrival date & time 02/03/12  4098   First MD Initiated Contact with Patient 02/03/12 1902      Chief Complaint  Patient presents with  . Suture / Staple Removal    (Consider location/radiation/quality/duration/timing/severity/associated sxs/prior treatment) Patient is a 21 y.o. male presenting with suture removal. The history is provided by the patient.  Suture / Staple Removal  The sutures were placed 7 to 10 days ago. There has been no drainage from the wound. There is no redness present. There is no swelling present. The pain has improved.    Past Medical History  Diagnosis Date  . 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 Penile abscess     Past Surgical History  Procedure Date  . Hand surgery   . Irrigation and debridement abscess 09/14/2011    Procedure: IRRIGATION AND DEBRIDEMENT ABSCESS;  Surgeon: Ky Barban, MD;  Location: [facility];  Service: Urology;  Laterality: N/A;  Incision and drainage of penile abscess    Family History  Problem Relation Age of Onset  . Diabetes Mother   . Schizophrenia Father     History  Substance Use Topics  . Smoking status: Current Everyday Smoker -- 0.5 packs/day for 8 years    Types: Cigarettes  . Smokeless tobacco: Not on file  . Alcohol Use: 4.0 oz/week    8 drink(s) per week     occ      Review of Systems  Constitutional: Negative for fever and chills.  HENT: Negative for facial swelling.   Respiratory: Negative for shortness of breath and wheezing.   Skin: Positive for wound.  Neurological: Negative for numbness.    Allergies  Review of patient's allergies indicates no known allergies.  Home Medications   Current Outpatient Rx  Name Route Sig Dispense Refill  . DIPHENHYDRAMINE-APAP (SLEEP) 25-500 MG PO TABS Oral Take 1 tablet by mouth at bedtime as needed.    Marland Kitchen HYDROCODONE-ACETAMINOPHEN 5-325 MG PO TABS Oral Take 1 tablet by mouth every 4 (four) hours as needed for pain. 15 tablet 0  .  PROMETHAZINE HCL 25 MG PO TABS Oral Take 1 tablet (25 mg total) by mouth every 6 (six) hours as needed for nausea. 12 tablet 0    BP 136/68  Pulse 61  Temp 98.5 F (36.9 C)  Resp 18  Ht 5\' 9"  (1.753 m)  Wt 190 lb (86.183 kg)  BMI 28.06 kg/m2  SpO2 99%  Physical Exam  Constitutional: He is oriented to person, place, and time. He appears well-developed and well-nourished.  HENT:  Head: Normocephalic.  Cardiovascular: Normal rate.   Pulmonary/Chest: Effort normal.  Neurological: He is alert and oriented to person, place, and time. No sensory deficit.  Skin: Laceration noted.       Well healing lacerations on bilateral forarms and left hand.  No erythema, drainage, swelling.  Left forearm laceration with slight shift in approximation,  But stable once staples removed.  Sterile strips applied for added support at this wound site.    ED Course  Procedures (including critical care time)  Labs Reviewed - No data to display No results found.   1. Encounter for removal of sutures    PA removed staples.  RN removed sutures   MDM  Prn f/u.  Pt without complaint at time of dc.        Burgess Amor, PA 02/04/12 1155

## 2012-02-05 NOTE — ED Provider Notes (Signed)
Medical screening examination/treatment/procedure(s) were performed by non-physician practitioner and as supervising physician I was immediately available for consultation/collaboration.  Donnetta Hutching, MD 02/05/12 [phone]

## 2012-04-19 ENCOUNTER — Emergency Department (HOSPITAL_COMMUNITY)
Admission: EM | Admit: 2012-04-19 | Discharge: 2012-04-19 | Disposition: A | Discharge status: Home / Self Care | Payer: Self-pay | Attending: Emergency Medicine | Admitting: Emergency Medicine

## 2012-04-19 ENCOUNTER — Encounter (HOSPITAL_COMMUNITY): Payer: Self-pay | Admitting: *Deleted

## 2012-04-19 DIAGNOSIS — Z9889 Other specified postprocedural states: Secondary | ICD-10-CM | POA: Insufficient documentation

## 2012-04-19 DIAGNOSIS — Z872 Personal history of diseases of the skin and subcutaneous tissue: Secondary | ICD-10-CM | POA: Insufficient documentation

## 2012-04-19 DIAGNOSIS — L02419 Cutaneous abscess of limb, unspecified: Secondary | ICD-10-CM | POA: Insufficient documentation

## 2012-04-19 DIAGNOSIS — F17200 Nicotine dependence, unspecified, uncomplicated: Secondary | ICD-10-CM | POA: Insufficient documentation

## 2012-04-19 DIAGNOSIS — Z8679 Personal history of other diseases of the circulatory system: Secondary | ICD-10-CM | POA: Insufficient documentation

## 2012-04-19 DIAGNOSIS — L03119 Cellulitis of unspecified part of limb: Secondary | ICD-10-CM | POA: Insufficient documentation

## 2012-04-19 DIAGNOSIS — L02415 Cutaneous abscess of right lower limb: Secondary | ICD-10-CM

## 2012-04-19 MED ORDER — DOXYCYCLINE HYCLATE 100 MG PO TABS
100.0000 mg | ORAL_TABLET | Freq: Once | ORAL | Status: AC
  Administered 2012-04-19: 100 mg via ORAL
  Filled 2012-04-19: qty 1

## 2012-04-19 MED ORDER — IBUPROFEN 800 MG PO TABS
800.0000 mg | ORAL_TABLET | Freq: Once | ORAL | Status: AC
  Administered 2012-04-19: 800 mg via ORAL
  Filled 2012-04-19: qty 1

## 2012-04-19 MED ORDER — DOXYCYCLINE HYCLATE 100 MG PO CAPS: 100.0000 mg | ORAL_CAPSULE | Freq: Two times a day (BID) | ORAL | Status: DC

## 2012-04-19 NOTE — ED Provider Notes (Signed)
History     CSN: [id number]  Arrival date & time 04/19/12  1152   None     Chief Complaint  Patient presents with  . Abscess    (Consider location/radiation/quality/duration/timing/severity/associated sxs/prior treatment) HPI Comments: States a pustular lesion came up on his R thigh yest and he has scratched and squeezed it.  Patient is a 21 y.o. male presenting with abscess. The history is provided by the patient. No language interpreter was used.  Abscess  This is a new problem. The current episode started yesterday. The problem has been gradually worsening. The abscess is present on the right upper leg. The problem is moderate. The abscess is characterized by redness and painfulness. The abscess first occurred at home. Pertinent negatives include no fever. There were no sick contacts. He has received no recent medical care.    Past Medical History  Diagnosis Date  . 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 Penile abscess     Past Surgical History  Procedure Date  . Hand surgery   . Irrigation and debridement abscess 09/14/2011    Procedure: IRRIGATION AND DEBRIDEMENT ABSCESS;  Surgeon: Ky Barban, MD;  Location: [facility];  Service: Urology;  Laterality: N/A;  Incision and drainage of penile abscess    Family History  Problem Relation Age of Onset  . Diabetes Mother   . Schizophrenia Father     History  Substance Use Topics  . Smoking status: Current Every Day Smoker -- 0.5 packs/day for 8 years    Types: Cigarettes  . Smokeless tobacco: Not on file  . Alcohol Use: 4.0 oz/week    8 drink(s) per week     Comment: occ      Review of Systems  Constitutional: Negative for fever and chills.  Skin: Negative for wound.  All other systems reviewed and are negative.    Allergies  Review of patient's allergies indicates no known allergies.  Home Medications   Current Outpatient Rx  Name  Route  Sig  Dispense  Refill  . DIPHENHYDRAMINE-APAP (SLEEP) 25-500 MG PO TABS  Oral   Take 1 tablet by mouth at bedtime as needed.         Marland Kitchen PROMETHAZINE HCL 25 MG PO TABS   Oral   Take 1 tablet (25 mg total) by mouth every 6 (six) hours as needed for nausea.   12 tablet   0     BP 142/80  Pulse 85  Temp 98.2 F (36.8 C) (Oral)  Resp 20  Ht 5\' 8"  (1.727 m)  Wt 175 lb (79.379 kg)  BMI 26.61 kg/m2  SpO2 97%  Physical Exam  Nursing note and vitals reviewed. Constitutional: He is oriented to person, place, and time. He appears well-developed and well-nourished.  HENT:  Head: Normocephalic and atraumatic.  Eyes: EOM are normal.  Neck: Normal range of motion.  Cardiovascular: Normal rate, regular rhythm and intact distal pulses.   Pulmonary/Chest: Effort normal. No respiratory distress.  Abdominal: Soft. He exhibits no distension. There is no tenderness.  Musculoskeletal: Normal range of motion.       Legs: Neurological: He is alert and oriented to person, place, and time.  Skin: Skin is warm and dry.  Psychiatric: He has a normal mood and affect. Judgment normal.    ED Course  Procedures (including critical care time)  Labs Reviewed - No data to display No results found.   1. Abscess of right thigh       MDM  rx-doxycycline, 20 Ibuprofen  Warm compresses Return prn        Evalina Field, [place] 04/19/12 1322

## 2012-04-19 NOTE — ED Provider Notes (Signed)
Medical screening examination/treatment/procedure(s) were performed by non-physician practitioner and as supervising physician I was immediately available for consultation/collaboration.  Adesuwa Osgood, MD 04/19/12 1523 

## 2012-04-19 NOTE — ED Notes (Signed)
Swollen area to rt thigh,

## 2012-04-21 ENCOUNTER — Emergency Department (HOSPITAL_COMMUNITY)
Admission: EM | Admit: 2012-04-21 | Discharge: 2012-04-21 | Disposition: A | Discharge status: Home / Self Care | Payer: Self-pay | Attending: Emergency Medicine | Admitting: Emergency Medicine

## 2012-04-21 ENCOUNTER — Encounter (HOSPITAL_COMMUNITY): Payer: Self-pay | Admitting: *Deleted

## 2012-04-21 DIAGNOSIS — F17200 Nicotine dependence, unspecified, uncomplicated: Secondary | ICD-10-CM | POA: Insufficient documentation

## 2012-04-21 DIAGNOSIS — Z8679 Personal history of other diseases of the circulatory system: Secondary | ICD-10-CM | POA: Insufficient documentation

## 2012-04-21 DIAGNOSIS — L02415 Cutaneous abscess of right lower limb: Secondary | ICD-10-CM

## 2012-04-21 DIAGNOSIS — L02419 Cutaneous abscess of limb, unspecified: Secondary | ICD-10-CM | POA: Insufficient documentation

## 2012-04-21 MED ORDER — LIDOCAINE HCL (PF) 1 % IJ SOLN
INTRAMUSCULAR | Status: AC
  Administered 2012-04-21: 5 mL
  Filled 2012-04-21: qty 5

## 2012-04-21 MED ORDER — HYDROCODONE-ACETAMINOPHEN 5-325 MG PO TABS: 1.0000 | ORAL_TABLET | ORAL | Status: DC | PRN

## 2012-04-21 NOTE — ED Notes (Signed)
Abscess to rt thigh, seen here for same, says it is no better.

## 2012-04-21 NOTE — ED Provider Notes (Signed)
History     CSN: [id number]  Arrival date & time 04/21/12  1131   None     Chief Complaint  Patient presents with  . Abscess    HPI Christian Rose is a 21 y.o. male who presents to the ED with abscess. The abscess is located on the right thigh. She has been evaluated here for the same a few day ago and given antibiotics but it has gotten worse. Patient states he needs pain medication. The history was provided by the patient and his medical record.  Past Medical History  Diagnosis Date  . 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 Penile abscess     Past Surgical History  Procedure Date  . Hand surgery   . Irrigation and debridement abscess 09/14/2011    Procedure: IRRIGATION AND DEBRIDEMENT ABSCESS;  Surgeon: Ky Barban, MD;  Location: [facility];  Service: Urology;  Laterality: N/A;  Incision and drainage of penile abscess    Family History  Problem Relation Age of Onset  . Diabetes Mother   . Schizophrenia Father     History  Substance Use Topics  . Smoking status: Current Every Day Smoker -- 0.5 packs/day for 8 years    Types: Cigarettes  . Smokeless tobacco: Not on file  . Alcohol Use: 4.0 oz/week    8 drink(s) per week     Comment: occ      Review of Systems  Constitutional: Negative for activity change and appetite change.  HENT: Negative for facial swelling and neck pain.   Eyes: Negative.   Respiratory: Negative.   Musculoskeletal:       See skin exam  Skin:       Raised red area right thigh  Psychiatric/Behavioral: Negative for confusion. The patient is not nervous/anxious.     Allergies  Review of patient's allergies indicates no known allergies.  Home Medications   Current Outpatient Rx  Name  Route  Sig  Dispense  Refill  . DIPHENHYDRAMINE-APAP (SLEEP) 25-500 MG PO TABS   Oral   Take 1 tablet by mouth at bedtime as needed.         Marland Kitchen DOXYCYCLINE HYCLATE 100 MG PO CAPS   Oral   Take 1 capsule (100 mg total) by mouth 2 (two) times daily.   20  capsule   0   . PROMETHAZINE HCL 25 MG PO TABS   Oral   Take 1 tablet (25 mg total) by mouth every 6 (six) hours as needed for nausea.   12 tablet   0     BP 135/79  Pulse 92  Temp 98.2 F (36.8 C) (Oral)  Resp 18  Ht 5\' 9"  (1.753 m)  Wt 180 lb (81.647 kg)  BMI 26.58 kg/m2  SpO2 99%  Physical Exam  Nursing note and vitals reviewed. Constitutional: He is oriented to person, place, and time. He appears well-developed and well-nourished. No distress.  Eyes: EOM are normal.  Neck: Neck supple.  Cardiovascular: Normal rate.   Pulmonary/Chest: Effort normal.  Musculoskeletal: Normal range of motion.  Neurological: He is alert and oriented to person, place, and time.  Skin:       Area mid right anterior thigh with erythema and warmth. Tender on palpation.  Psychiatric: He has a normal mood and affect. His behavior is normal. Judgment and thought content normal.   INCISION AND DRAINAGE Performed by: NEESE,HOPE Consent: Verbal consent obtained.  Risks and benefits: risks, benefits and alternatives were discussed Type: abscess  Body  area: right anterior thigh  Cleaned with betadine  Anesthesia: local infiltration  Local anesthetic: lidocaine 1% Anesthetic total: 2 ml  Complexity: complex  Incision with # 11 blade  Blunt dissection to break up loculations  Drainage: purulent  Drainage amount: moderate  Packing material: none Patient tolerance: Patient tolerated the procedure well with no immediate complications.  Dry dressing to collect drainage  Assessment: 21 y.o. male with abscess to right thigh  Plan:  Continue antibiotics, continue warm wet compresses   Hydrocodone Rx   Return as needed  I have reviewed this patient's vital signs and nurses notes. I have discussed procedure and plan of care with the patient and he voices understanding.   Medication List     As of 04/21/2012 12:34 PM    START taking these medications          HYDROcodone-acetaminophen 5-325 MG per tablet   Commonly known as: NORCO/VICODIN   Take 1 tablet by mouth every 4 (four) hours as needed for pain.      ASK your doctor about these medications         diphenhydramine-acetaminophen 25-500 MG Tabs   Commonly known as: TYLENOL PM      doxycycline 100 MG capsule   Commonly known as: VIBRAMYCIN   Take 1 capsule (100 mg total) by mouth 2 (two) times daily.      promethazine 25 MG tablet   Commonly known as: PHENERGAN   Take 1 tablet (25 mg total) by mouth every 6 (six) hours as needed for nausea.          Where to get your medications    These are the prescriptions that you need to pick up.   You may get these medications from any pharmacy.         HYDROcodone-acetaminophen 5-325 MG per tablet           Procedures        Janne Napoleon, NP 04/21/12 1234

## 2012-04-21 NOTE — ED Provider Notes (Signed)
Medical screening examination/treatment/procedure(s) were performed by non-physician practitioner and as supervising physician I was immediately available for consultation/collaboration.  Deundra Bard, MD 04/21/12 1546 

## 2012-07-24 ENCOUNTER — Encounter (HOSPITAL_COMMUNITY): Payer: Self-pay | Admitting: *Deleted

## 2012-07-24 ENCOUNTER — Emergency Department (HOSPITAL_COMMUNITY)
Admission: EM | Admit: 2012-07-24 | Discharge: 2012-07-24 | Disposition: A | Discharge status: Home / Self Care | Payer: Self-pay | Attending: Emergency Medicine | Admitting: Emergency Medicine

## 2012-07-24 DIAGNOSIS — Z8614 Personal history of Methicillin resistant Staphylococcus aureus infection: Secondary | ICD-10-CM | POA: Insufficient documentation

## 2012-07-24 DIAGNOSIS — Z8679 Personal history of other diseases of the circulatory system: Secondary | ICD-10-CM | POA: Insufficient documentation

## 2012-07-24 DIAGNOSIS — F17200 Nicotine dependence, unspecified, uncomplicated: Secondary | ICD-10-CM | POA: Insufficient documentation

## 2012-07-24 DIAGNOSIS — N489 Disorder of penis, unspecified: Secondary | ICD-10-CM | POA: Insufficient documentation

## 2012-07-24 DIAGNOSIS — Z79899 Other long term (current) drug therapy: Secondary | ICD-10-CM | POA: Insufficient documentation

## 2012-07-24 DIAGNOSIS — N4829 Other inflammatory disorders of penis: Secondary | ICD-10-CM | POA: Insufficient documentation

## 2012-07-24 MED ORDER — CEFTRIAXONE SODIUM 1 G IJ SOLR
1.0000 g | Freq: Once | INTRAMUSCULAR | Status: AC
  Administered 2012-07-24: 1 g via INTRAMUSCULAR
  Filled 2012-07-24: qty 10

## 2012-07-24 MED ORDER — SULFAMETHOXAZOLE-TRIMETHOPRIM 800-160 MG PO TABS: ORAL_TABLET | ORAL | Status: DC

## 2012-07-24 MED ORDER — HYDROCODONE-ACETAMINOPHEN 5-325 MG PO TABS: ORAL_TABLET | ORAL | Status: DC

## 2012-07-24 MED ORDER — CIPROFLOXACIN HCL 500 MG PO TABS: 500.0000 mg | ORAL_TABLET | Freq: Two times a day (BID) | ORAL | Status: DC

## 2012-07-24 MED ORDER — HYDROCODONE-ACETAMINOPHEN 5-325 MG PO TABS
2.0000 | ORAL_TABLET | Freq: Once | ORAL | Status: AC
  Administered 2012-07-24: 2 via ORAL
  Filled 2012-07-24: qty 2

## 2012-07-24 MED ORDER — SULFAMETHOXAZOLE-TMP DS 800-160 MG PO TABS
1.0000 | ORAL_TABLET | Freq: Once | ORAL | Status: AC
  Administered 2012-07-24: 1 via ORAL
  Filled 2012-07-24: qty 1

## 2012-07-24 MED ORDER — IBUPROFEN 800 MG PO TABS
800.0000 mg | ORAL_TABLET | Freq: Once | ORAL | Status: AC
  Administered 2012-07-24: 800 mg via ORAL
  Filled 2012-07-24: qty 1

## 2012-07-24 NOTE — ED Provider Notes (Signed)
Medical screening examination/treatment/procedure(s) were performed by non-physician practitioner and as supervising physician I was immediately available for consultation/collaboration.  Gilda Crease, MD 07/24/12 2008

## 2012-07-24 NOTE — ED Notes (Signed)
Abscess to groin area ?

## 2012-07-24 NOTE — ED Provider Notes (Signed)
History     CSN: [id number]  Arrival date & time 07/24/12  1638   First MD Initiated Contact with Patient 07/24/12 1806      Chief Complaint  Patient presents with  . Abscess    (Consider location/radiation/quality/duration/timing/severity/associated sxs/prior treatment) Patient is a 22 y.o. male presenting with abscess. The history is provided by the patient.  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 Penile abscess     Past Surgical History  Procedure Laterality Date  . Hand surgery    . Irrigation and debridement abscess  09/14/2011    Procedure: IRRIGATION AND DEBRIDEMENT ABSCESS;  Surgeon: Ky Barban, MD;  Location: [facility];  Service: Urology;  Laterality: N/A;  Incision and drainage of penile abscess    Family History  Problem Relation Age of Onset  . Diabetes Mother   . Schizophrenia Father     History  Substance Use Topics  . Smoking status: Current Every Day Smoker -- 0.50 packs/day for 8 years    Types: Cigarettes  . Smokeless tobacco: Not on file  . Alcohol Use: 4.0 oz/week    8 drink(s) per week     Comment: occ      Review of Systems  Constitutional: Negative for fever and activity change.       All ROS Neg except as noted in HPI  HENT: Negative for nosebleeds and neck pain.   Eyes: Negative for photophobia and discharge.  Respiratory: Negative for cough, shortness of breath and wheezing.   Cardiovascular: Negative for chest pain and  palpitations.  Gastrointestinal: Negative for nausea, abdominal pain, blood in stool and anorexia.  Genitourinary: Positive for penile pain. Negative for dysuria, frequency, hematuria, scrotal swelling, difficulty urinating and testicular pain.  Musculoskeletal: Negative for back pain and arthralgias.  Skin: Negative.        abscess  Neurological: Negative for dizziness, seizures and speech difficulty.  Psychiatric/Behavioral: Negative for hallucinations and confusion.    Allergies  Review of patient's allergies indicates no known allergies.  Home Medications   Current Outpatient Rx  Name  Route  Sig  Dispense  Refill  . diphenhydramine-acetaminophen (TYLENOL PM) 25-500 MG TABS   Oral   Take 1 tablet by mouth at bedtime as needed (for sleep).         . ciprofloxacin (CIPRO) 500 MG tablet   Oral   Take 1 tablet (500 mg total) by mouth 2 (two) times daily.   14 tablet   0   . HYDROcodone-acetaminophen (NORCO/VICODIN) 5-325 MG per tablet      1 or 2 po q4h prn pain.   20 tablet   0   . sulfamethoxazole-trimethoprim (SEPTRA DS) 800-160 MG per tablet      1 po two times daily with food   10 tablet   0     BP 154/78  Pulse 94  Temp(Src) 98.6 F (37 C) (Oral)  Resp 18  Ht 5\' 8"  (1.727 m)  Wt 170 lb (77.111 kg)  BMI 25.85 kg/m2  SpO2 99%  Physical Exam  Nursing note and vitals reviewed. Constitutional: He is oriented to person, place, and time. He appears well-developed and well-nourished.  Non-toxic appearance.  HENT:  Head: Normocephalic.  Right Ear: Tympanic membrane and external ear normal.  Left Ear: Tympanic membrane and external ear normal.  Eyes: EOM and lids are normal. Pupils are equal, round, and reactive to light.  Neck: Normal range of motion. Neck supple. Carotid bruit is not present.  Cardiovascular: Normal rate, regular rhythm, normal heart sounds, intact distal pulses and normal pulses.   Pulmonary/Chest: Breath sounds normal. No respiratory  distress.  Abdominal: Soft. Bowel sounds are normal. There is no tenderness. There is no guarding.  Genitourinary:  There is a nickel size raised red area of the inferior lateral portion of the shaft of the penis. No penile discharge. No drainage for the raised red area.   There is no red streaking noted. There is no testicular tenderness present. There a few very small palpable inguinal lymph nodes noted. Is no perianal involvement.  Musculoskeletal: Normal range of motion.  Lymphadenopathy:       Head (right side): No submandibular adenopathy present.       Head (left side): No submandibular adenopathy present.    He has no cervical adenopathy.  Neurological: He is alert and oriented to person, place, and time. He has normal strength. No cranial nerve deficit or sensory deficit.  Skin: Skin is warm and dry.  Psychiatric: He has a normal mood and affect. His speech is normal.    ED Course  Procedures (including critical care time)  Labs Reviewed - No data to display No results found.   1. Abscess of shaft of penis       MDM  I have reviewed nursing notes, vital signs, and all appropriate lab and imaging results for this patient. Patient presents with 2 days of what appears to be an abscess of the shaft of the penis. He has not had any high fever, nausea, or vomiting. He is noticing some increasing pain in the area. There's been no drainage or discharge from the penis nor from the raised red area. It is of note that the patient had an abscess in the inguinal area in April of 2013 and he is concerned about a recurrence of this problem.  There is no significant fluctuance of the raised red area at this time and there is no red streaking noted up. At this time. The patient is treated in the emergency department with 1 g of Rocephin and 1 Septra DS tablet. Patient given a prescription for Cipro 500 mg 2 times daily, Septra one 2 times daily, and Norco for pain #20 tablets. Patient is  referred to urology for followup and management. Patient is invited to return to the emergency department if any problems before his appointment with urology.       Kathie Dike, [place] 07/24/12 [phone]

## 2012-09-25 ENCOUNTER — Encounter (HOSPITAL_COMMUNITY): Payer: Self-pay | Admitting: *Deleted

## 2012-09-25 ENCOUNTER — Emergency Department (HOSPITAL_COMMUNITY)
Admission: EM | Admit: 2012-09-25 | Discharge: 2012-09-25 | Disposition: A | Discharge status: Home / Self Care | Payer: Self-pay | Attending: Emergency Medicine | Admitting: Emergency Medicine

## 2012-09-25 DIAGNOSIS — F17200 Nicotine dependence, unspecified, uncomplicated: Secondary | ICD-10-CM | POA: Insufficient documentation

## 2012-09-25 DIAGNOSIS — Y929 Unspecified place or not applicable: Secondary | ICD-10-CM | POA: Insufficient documentation

## 2012-09-25 DIAGNOSIS — Z8679 Personal history of other diseases of the circulatory system: Secondary | ICD-10-CM | POA: Insufficient documentation

## 2012-09-25 DIAGNOSIS — IMO0001 Reserved for inherently not codable concepts without codable children: Secondary | ICD-10-CM | POA: Insufficient documentation

## 2012-09-25 DIAGNOSIS — L089 Local infection of the skin and subcutaneous tissue, unspecified: Secondary | ICD-10-CM

## 2012-09-25 DIAGNOSIS — Y9389 Activity, other specified: Secondary | ICD-10-CM | POA: Insufficient documentation

## 2012-09-25 MED ORDER — SULFAMETHOXAZOLE-TRIMETHOPRIM 800-160 MG PO TABS: 1.0000 | ORAL_TABLET | Freq: Two times a day (BID) | ORAL | Status: DC

## 2012-09-25 MED ORDER — HYDROCODONE-ACETAMINOPHEN 5-325 MG PO TABS: 1.0000 | ORAL_TABLET | ORAL | Status: DC | PRN

## 2012-09-25 NOTE — ED Notes (Signed)
Raised , red, pustular area to lt wrist.  Has similar areas to both arms that have scarred over.

## 2012-09-25 NOTE — ED Provider Notes (Signed)
Medical screening examination/treatment/procedure(s) were performed by non-physician practitioner and as supervising physician I was immediately available for consultation/collaboration.   Cornelius Schuitema L Krissi Willaims, MD 09/25/12 2324 

## 2012-09-25 NOTE — ED Notes (Signed)
Abscess to left wrist noticed this morning.

## 2012-09-25 NOTE — ED Provider Notes (Signed)
History     CSN: [id number]  Arrival date & time 09/25/12  1153   First MD Initiated Contact with Patient 09/25/12 1526      Chief Complaint  Patient presents with  . Abscess    (Consider location/radiation/quality/duration/timing/severity/associated sxs/prior treatment) HPI Christian Rose is a 22 y.o. male who presents to the ED with skin infection. Patient states he woke this morning and noted a place on his left wrist that was red and tender. Looks as if an insect bit him.  He denies any other problems.   Past Medical History  Diagnosis Date  . 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 Penile abscess     Past Surgical History  Procedure Laterality Date  . Hand surgery    . Irrigation and debridement abscess  09/14/2011    Procedure: IRRIGATION AND DEBRIDEMENT ABSCESS;  Surgeon: Ky Barban, MD;  Location: [facility];  Service: Urology;  Laterality: N/A;  Incision and drainage of penile abscess    Family History  Problem Relation Age of Onset  . Diabetes Mother   . Schizophrenia Father     History  Substance Use Topics  . Smoking status: Current Every Day Smoker -- 0.50 packs/day for 8 years    Types: Cigarettes  . Smokeless tobacco: Not on file  . Alcohol Use: 4.0 oz/week    8 drink(s) per week     Comment: occ      Review of Systems  Constitutional: Negative for fever and activity change.  HENT: Negative for neck pain.   Respiratory: Negative for shortness of breath.   Gastrointestinal: Negative for nausea and vomiting.  Skin: Positive for wound.  Allergic/Immunologic: Negative for immunocompromised state.  Psychiatric/Behavioral: Negative for confusion. The patient is not nervous/anxious.     Allergies  Review of patient's allergies indicates no known allergies.  Home Medications   Current Outpatient Rx  Name  Route  Sig  Dispense  Refill  . ciprofloxacin (CIPRO) 500 MG tablet   Oral   Take 1 tablet (500 mg total) by mouth 2 (two) times daily.   14 tablet   0   . diphenhydramine-acetaminophen (TYLENOL PM) 25-500 MG TABS   Oral   Take 1 tablet by mouth at bedtime as needed (for sleep).         Marland Kitchen HYDROcodone-acetaminophen (NORCO/VICODIN) 5-325 MG per tablet      1 or 2 po q4h prn pain.   20 tablet   0   . sulfamethoxazole-trimethoprim (SEPTRA DS) 800-160 MG per tablet      1 po two times daily with food   10 tablet   0     BP 144/71  Pulse 74  Temp(Src) 98.6 F (37 C) (Oral)  Resp 18  Ht 5\' 8"  (1.727 m)  Wt 185 lb (83.915 kg)  BMI 28.14 kg/m2  SpO2 100%  Physical Exam  Nursing note and vitals reviewed. Constitutional: He is oriented to person, place, and time. He appears well-developed and well-nourished. No distress.  HENT:  Head: Normocephalic.  Eyes: EOM are normal.  Neck: Neck supple.  Cardiovascular: Normal rate.   Pulmonary/Chest: Effort normal. No respiratory distress.  Abdominal: Soft. There is no tenderness.  Musculoskeletal: Normal range of motion.       Left wrist: He exhibits tenderness. He exhibits normal range of motion.       Arms: There is a small raised area on the left wrist that appears as an insect bite. There is a small area that is  pustular surrounded by approximately 1cm of erythema. The area is tender on exam. Radial pulse present, adequate circulation and good touch sensation.  Neurological: He is alert and oriented to person, place, and time. No cranial nerve deficit.  Skin: Skin is warm and dry. Rash noted. There is erythema.  Psychiatric: He has a normal mood and affect. His behavior is normal. Judgment and thought content normal.    ED Course  Procedures (including critical care time) Assessment: 22 y.o. male with infected insect bite  Plan:  Antibiotics   Pain management   Warm wet compresses to the area, return if symptoms worsen  MDM  Discussed with the patient and all questioned fully answered. He will return if any problems arise.    Medication List    TAKE these  medications       HYDROcodone-acetaminophen 5-325 MG per tablet  Commonly known as:  NORCO/VICODIN  Take 1 tablet by mouth every 4 (four) hours as needed.     sulfamethoxazole-trimethoprim 800-160 MG per tablet  Commonly known as:  SEPTRA DS  Take 1 tablet by mouth every 12 (twelve) hours.               [facility] Orlene Och, [place] 09/25/12 1721

## 2012-12-04 ENCOUNTER — Emergency Department (HOSPITAL_COMMUNITY)
Admission: EM | Admit: 2012-12-04 | Discharge: 2012-12-04 | Disposition: A | Discharge status: Home / Self Care | Payer: Self-pay | Attending: Emergency Medicine | Admitting: Emergency Medicine

## 2012-12-04 ENCOUNTER — Encounter (HOSPITAL_COMMUNITY): Payer: Self-pay | Admitting: *Deleted

## 2012-12-04 DIAGNOSIS — L0291 Cutaneous abscess, unspecified: Secondary | ICD-10-CM

## 2012-12-04 DIAGNOSIS — Z87448 Personal history of other diseases of urinary system: Secondary | ICD-10-CM | POA: Insufficient documentation

## 2012-12-04 DIAGNOSIS — L02419 Cutaneous abscess of limb, unspecified: Secondary | ICD-10-CM | POA: Insufficient documentation

## 2012-12-04 DIAGNOSIS — Z8679 Personal history of other diseases of the circulatory system: Secondary | ICD-10-CM | POA: Insufficient documentation

## 2012-12-04 MED ORDER — SULFAMETHOXAZOLE-TRIMETHOPRIM 800-160 MG PO TABS: 1.0000 | ORAL_TABLET | Freq: Two times a day (BID) | ORAL | Status: DC

## 2012-12-04 MED ORDER — HYDROCODONE-ACETAMINOPHEN 5-325 MG PO TABS: ORAL_TABLET | ORAL | Status: DC

## 2012-12-04 NOTE — ED Notes (Signed)
?  abscess or insect bite to rt thigh , noticed this am.

## 2012-12-06 NOTE — ED Provider Notes (Signed)
Medical screening examination/treatment/procedure(s) were performed by non-physician practitioner and as supervising physician I was immediately available for consultation/collaboration. Devoria Albe, MD, FACEP   Ward Givens, MD 12/06/12 [phone]

## 2012-12-06 NOTE — ED Provider Notes (Signed)
History    CSN: [id number] Arrival date & time 12/04/12  1155  First MD Initiated Contact with Patient 12/04/12 1253     Chief Complaint  Patient presents with  . Abscess   (Consider location/radiation/quality/duration/timing/severity/associated sxs/prior Treatment) HPI Comments: Christian Rose is a 22 y.o. male who presents to the Emergency Department complaining of two small red "bumps" to his right thigh that he noticed on the morning prior to ED arrival.  He has a hx of [name] and wasn't sure if the bumps are boils or insect bites.  He also reports some itching to these areas and "soreness" to his thigh.  He denies fever, chills, rash, known tick bite or joint pain.  He has not tried any home therapies prior to ED arrival.    Patient is a 22 y.o. male presenting with abscess. The history is provided by the patient.  Abscess Associated symptoms: no fever, no nausea and no vomiting    Past Medical History  Diagnosis Date  . 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 Penile abscess    Past Surgical History  Procedure Laterality Date  . Hand surgery    . Irrigation and debridement abscess  09/14/2011    Procedure: IRRIGATION AND DEBRIDEMENT ABSCESS;  Surgeon: Ky Barban, MD;  Location: [facility];  Service: Urology;  Laterality: N/A;  Incision and drainage of penile abscess   Family History  Problem Relation Age of Onset  . Diabetes Mother   . Schizophrenia Father    History  Substance Use Topics  . Smoking status: Current Every Day Smoker -- 0.50 packs/day for 8 years    Types: Cigarettes  . Smokeless tobacco: Not on file  . Alcohol Use: 4.0 oz/week    8 drink(s) per week     Comment: occ    Review of Systems  Constitutional: Negative for fever and chills.  Gastrointestinal: Negative for nausea and vomiting.  Musculoskeletal: Negative for joint swelling and arthralgias.  Skin: Positive for color change. Negative for rash.       Two red bumps to the right thigh  Hematological:  Negative for adenopathy.  All other systems reviewed and are negative.    Allergies  Review of patient's allergies indicates not on file.  Home Medications   Current Outpatient Rx  Name  Route  Sig  Dispense  Refill  . HYDROcodone-acetaminophen (NORCO/VICODIN) 5-325 MG per tablet      Take one-two tabs po q 4-6 hrs prn pain   10 tablet   0   . sulfamethoxazole-trimethoprim (SEPTRA DS) 800-160 MG per tablet   Oral   Take 1 tablet by mouth 2 (two) times daily.   28 tablet   0    Ht 5\' 8"  (1.727 m)  Wt 180 lb (81.647 kg)  BMI 27.38 kg/m2 Physical Exam  Nursing note and vitals reviewed. Constitutional: He is oriented to person, place, and time. He appears well-developed and well-nourished. No distress.  HENT:  Head: Normocephalic and atraumatic.  Mouth/Throat: Oropharynx is clear and moist.  Cardiovascular: Normal rate, regular rhythm, normal heart sounds and intact distal pulses.   No murmur heard. Pulmonary/Chest: Effort normal and breath sounds normal. No respiratory distress. He exhibits no tenderness.  Musculoskeletal: Normal range of motion. He exhibits no tenderness.  Neurological: He is alert and oriented to person, place, and time. He exhibits normal muscle tone. Coordination normal.  Skin: Skin is warm and dry.  Two dime sized erythematous papules to the lateral right thigh.  No  pustules, drainage or surrounding erythema.  No edema. Distal sensation intact,     ED Course  Procedures (including critical care time) Labs Reviewed - No data to display No results found. 1. Abscess     MDM    Pt is non-toxic appearing, no surrounding cellulitis, has hx of [name] so I will treat with Septra. I&D not indicated at this time.   He agrees to return here if sx's worsen.  Saamir Armstrong L. Elmar Antigua, PA-C 12/06/12 1139

## 2013-01-13 ENCOUNTER — Encounter (HOSPITAL_COMMUNITY): Payer: Self-pay | Admitting: *Deleted

## 2013-01-13 ENCOUNTER — Emergency Department (HOSPITAL_COMMUNITY)
Admission: EM | Admit: 2013-01-13 | Discharge: 2013-01-13 | Disposition: A | Discharge status: Home / Self Care | Payer: Self-pay | Attending: Emergency Medicine | Admitting: Emergency Medicine

## 2013-01-13 DIAGNOSIS — Z8614 Personal history of Methicillin resistant Staphylococcus aureus infection: Secondary | ICD-10-CM | POA: Insufficient documentation

## 2013-01-13 DIAGNOSIS — IMO0002 Reserved for concepts with insufficient information to code with codable children: Secondary | ICD-10-CM | POA: Insufficient documentation

## 2013-01-13 DIAGNOSIS — F17200 Nicotine dependence, unspecified, uncomplicated: Secondary | ICD-10-CM | POA: Insufficient documentation

## 2013-01-13 DIAGNOSIS — Z8679 Personal history of other diseases of the circulatory system: Secondary | ICD-10-CM | POA: Insufficient documentation

## 2013-01-13 DIAGNOSIS — L02413 Cutaneous abscess of right upper limb: Secondary | ICD-10-CM

## 2013-01-13 MED ORDER — LIDOCAINE HCL (PF) 1 % IJ SOLN
INTRAMUSCULAR | Status: AC
  Filled 2013-01-13: qty 5

## 2013-01-13 MED ORDER — HYDROCODONE-ACETAMINOPHEN 5-325 MG PO TABS
1.0000 | ORAL_TABLET | Freq: Once | ORAL | Status: AC
  Administered 2013-01-13: 1 via ORAL
  Filled 2013-01-13: qty 1

## 2013-01-13 MED ORDER — SULFAMETHOXAZOLE-TMP DS 800-160 MG PO TABS
1.0000 | ORAL_TABLET | Freq: Once | ORAL | Status: AC
  Administered 2013-01-13: 1 via ORAL
  Filled 2013-01-13: qty 1

## 2013-01-13 MED ORDER — HYDROCODONE-ACETAMINOPHEN 5-325 MG PO TABS: 1.0000 | ORAL_TABLET | ORAL | Status: DC | PRN

## 2013-01-13 MED ORDER — SULFAMETHOXAZOLE-TRIMETHOPRIM 800-160 MG PO TABS: 1.0000 | ORAL_TABLET | Freq: Two times a day (BID) | ORAL | Status: DC

## 2013-01-13 NOTE — ED Notes (Signed)
Pt presents with abscess to right posterior forearm. Pt first noticed area yesterday. Pt reports pain and redness to area. Denies drainage.

## 2013-01-13 NOTE — ED Provider Notes (Signed)
CSN: [id number]     Arrival date & time 01/13/13  1733 History     First MD Initiated Contact with Patient 01/13/13 1904     Chief Complaint  Patient presents with  . Abscess   (Consider location/radiation/quality/duration/timing/severity/associated sxs/prior Treatment) Patient is a 22 y.o. male presenting with abscess. The history is provided by the patient.  Abscess Location:  Shoulder/arm Shoulder/arm abscess location:  R forearm Abscess quality: painful   Red streaking: no   Duration:  2 days Progression:  Worsening Pain details:    Quality:  Throbbing and burning   Severity:  Moderate   Timing:  Constant   Progression:  Worsening Chronicity:  New Relieved by:  Nothing Worsened by:  Draining/squeezing Ineffective treatments:  None tried Associated symptoms: no fever, no headaches, no nausea and no vomiting    Christian Rose is a 22 y.o. male who presents to the ED with an abscess to the right forearm. He has a history of [name] and has had to have areas I&D in the past. He denies fever or chills, or any other problems.  Past Medical History  Diagnosis Date  . 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 Penile abscess    Past Surgical History  Procedure Laterality Date  . Hand surgery    . Irrigation and debridement abscess  09/14/2011    Procedure: IRRIGATION AND DEBRIDEMENT ABSCESS;  Surgeon: Ky Barban, MD;  Location: [facility];  Service: Urology;  Laterality: N/A;  Incision and drainage of penile abscess   Family History  Problem Relation Age of Onset  . Diabetes Mother   . Schizophrenia Father    History  Substance Use Topics  . Smoking status: Current Every Day Smoker -- 0.50 packs/day for 8 years    Types: Cigarettes  . Smokeless tobacco: Not on file  . Alcohol Use: 4.0 oz/week    8 drink(s) per week     Comment: occ    Review of Systems  Constitutional: Negative for fever and chills.  HENT: Negative for neck pain.   Gastrointestinal: Negative for nausea and  vomiting.  Musculoskeletal:       Abscess right forearm  Skin: Positive for wound.  Neurological: Negative for headaches.  Psychiatric/Behavioral: The patient is not nervous/anxious.     Allergies  Review of patient's allergies indicates no known allergies.  Home Medications  No current outpatient prescriptions on file. BP 133/73  Pulse 88  Temp(Src) 98.7 F (37.1 C) (Oral)  Resp 18  Ht 5\' 9"  (1.753 m)  Wt 180 lb (81.647 kg)  BMI 26.57 kg/m2  SpO2 97% Physical Exam  Nursing note and vitals reviewed. Constitutional: He is oriented to person, place, and time. He appears well-developed and well-nourished. No distress.  HENT:  Head: Normocephalic.  Eyes: EOM are normal.  Neck: Neck supple.  Cardiovascular: Normal rate.   Pulmonary/Chest: Effort normal.  Musculoskeletal:       Right forearm: He exhibits tenderness and swelling.       Arms: Raised, tender area right forearm.   Neurological: He is alert and oriented to person, place, and time. He has normal strength. No cranial nerve deficit or sensory deficit.  Skin: Skin is warm and dry.  Psychiatric: He has a normal mood and affect.    ED Course   Procedures INCISION AND DRAINAGE Performed by: Briar Witherspoon Consent: Verbal consent obtained. Risks and benefits: risks, benefits and alternatives were discussed Type: abscess  Body area: right forearm  Cleaned with betadine  Draped  Anesthesia: local infiltration  Local anesthetic: lidocaine 1% without epinephrine  Anesthetic total: 2 ml  Incision with # 11 blade  Complexity: complex  Blunt dissection to break up loculations  Drainage: bloody, purulent  Drainage amount: amall  Packing material: 1/4 in iodoform gauze  Patient tolerance: Patient tolerated the procedure well with no immediate complications.     MDM  22 y.o. male with abscess to right forearm. Will treat with antibiotics and pain management. Patient to remove the packing in 2 days. He  will continue warm wet compresses to the area. He will return if symptoms do not improve, he develops fever, red streaking or other problems.   Medication List         HYDROcodone-acetaminophen 5-325 MG per tablet  Commonly known as:  NORCO/VICODIN  Take 1 tablet by mouth every 4 (four) hours as needed.     sulfamethoxazole-trimethoprim 800-160 MG per tablet  Commonly known as:  SEPTRA DS  Take 1 tablet by mouth 2 (two) times daily.         Janne Napoleon, [place] 01/13/13 2011

## 2013-01-13 NOTE — ED Provider Notes (Signed)
History/physical exam/procedure(s) were performed by non-physician practitioner and as supervising physician I was immediately available for consultation/collaboration. I have reviewed all notes and am in agreement with care and plan.   Hilario Quarry, MD 01/13/13 2135

## 2013-01-13 NOTE — ED Notes (Signed)
Pt c/o abscess to right forearm that he noticed yesterday, denies any drainage

## 2013-01-17 ENCOUNTER — Encounter (HOSPITAL_COMMUNITY): Payer: Self-pay

## 2013-01-17 ENCOUNTER — Emergency Department (HOSPITAL_COMMUNITY)
Admission: EM | Admit: 2013-01-17 | Discharge: 2013-01-17 | Disposition: A | Discharge status: Home / Self Care | Payer: Self-pay | Attending: Emergency Medicine | Admitting: Emergency Medicine

## 2013-01-17 DIAGNOSIS — F17200 Nicotine dependence, unspecified, uncomplicated: Secondary | ICD-10-CM | POA: Insufficient documentation

## 2013-01-17 DIAGNOSIS — L02419 Cutaneous abscess of limb, unspecified: Secondary | ICD-10-CM | POA: Insufficient documentation

## 2013-01-17 DIAGNOSIS — Z87448 Personal history of other diseases of urinary system: Secondary | ICD-10-CM | POA: Insufficient documentation

## 2013-01-17 DIAGNOSIS — Z79899 Other long term (current) drug therapy: Secondary | ICD-10-CM | POA: Insufficient documentation

## 2013-01-17 DIAGNOSIS — Z8679 Personal history of other diseases of the circulatory system: Secondary | ICD-10-CM | POA: Insufficient documentation

## 2013-01-17 DIAGNOSIS — L539 Erythematous condition, unspecified: Secondary | ICD-10-CM | POA: Insufficient documentation

## 2013-01-17 DIAGNOSIS — L02415 Cutaneous abscess of right lower limb: Secondary | ICD-10-CM

## 2013-01-17 DIAGNOSIS — L03119 Cellulitis of unspecified part of limb: Secondary | ICD-10-CM | POA: Insufficient documentation

## 2013-01-17 MED ORDER — OXYCODONE-ACETAMINOPHEN 5-325 MG PO TABS
1.0000 | ORAL_TABLET | Freq: Once | ORAL | Status: AC
  Administered 2013-01-17: 1 via ORAL

## 2013-01-17 MED ORDER — OXYCODONE-ACETAMINOPHEN 5-325 MG PO TABS: 1.0000 | ORAL_TABLET | ORAL | Status: DC | PRN

## 2013-01-17 MED ORDER — OXYCODONE-ACETAMINOPHEN 5-325 MG PO TABS
ORAL_TABLET | ORAL | Status: AC
  Filled 2013-01-17: qty 1

## 2013-01-17 MED ORDER — LIDOCAINE HCL (PF) 1 % IJ SOLN
INTRAMUSCULAR | Status: AC
  Administered 2013-01-17: 12:00:00
  Filled 2013-01-17: qty 5

## 2013-01-17 MED ORDER — CEPHALEXIN 500 MG PO CAPS: 500.0000 mg | ORAL_CAPSULE | Freq: Four times a day (QID) | ORAL | Status: DC

## 2013-01-17 NOTE — ED Notes (Signed)
Pt reports woke up with abscess to r thigh.   

## 2013-01-17 NOTE — Progress Notes (Signed)
ED/CM noted pt did not have health insurance, and/or PCP. Patient was given the ED [place] uninsured handout with information for the clinics, food pantries, and the handout for insurance sign-up. Patient expressed appreciation for this.      

## 2013-01-18 NOTE — ED Provider Notes (Signed)
Medical screening examination/treatment/procedure(s) were performed by non-physician practitioner and as supervising physician I was immediately available for consultation/collaboration.   Laray Anger, DO 01/18/13 1558

## 2013-01-18 NOTE — ED Provider Notes (Signed)
CSN: [id number]     Arrival date & time 01/17/13  1039 History     First MD Initiated Contact with Patient 01/17/13 1058     Chief Complaint  Patient presents with  . Abscess   (Consider location/radiation/quality/duration/timing/severity/associated sxs/prior Treatment) HPI Comments: Christian Rose is a 22 y.o. Male who woke today with another abscess on his right upper lateral thigh today.  He is currently being treated with septra for an abscess I & D'd 4 days ago from his right forearm which is currently improved.  He describes constant pain without radiation which is worse with palpation.  He denies fevers, chills, nausea, vomiting.  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     The history is provided by the patient.    Past Medical History  Diagnosis Date  . 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 Penile abscess    Past Surgical History  Procedure Laterality Date  . Hand surgery    . Irrigation and debridement abscess  09/14/2011    Procedure: IRRIGATION AND DEBRIDEMENT ABSCESS;  Surgeon: Ky Barban, MD;  Location: [facility];  Service: Urology;  Laterality: N/A;  Incision and drainage of penile abscess   Family History  Problem Relation Age of Onset  . Diabetes Mother   . Schizophrenia Father    History  Substance Use Topics  . Smoking status: Current Every Day Smoker -- 0.50 packs/day for 8 years    Types: Cigarettes  . Smokeless tobacco: Not on file  . Alcohol Use: 4.0 oz/week    8 drink(s) per week     Comment: occ    Review of Systems  Constitutional: Negative for fever and chills.  HENT: Negative for facial swelling.   Respiratory: Negative for shortness of breath and wheezing.   Skin: Positive for color change and wound.  Neurological: Negative for numbness.    Allergies  Review of patient's allergies indicates no known allergies.  Home Medications   Current Outpatient Rx  Name  Route  Sig  Dispense  Refill  . HYDROcodone-acetaminophen  (NORCO/VICODIN) 5-325 MG per tablet   Oral   Take 1 tablet by mouth every 4 (four) hours as needed.   15 tablet   0   . sulfamethoxazole-trimethoprim (SEPTRA DS) 800-160 MG per tablet   Oral   Take 1 tablet by mouth 2 (two) times daily.   14 tablet   0   . cephALEXin (KEFLEX) 500 MG capsule   Oral   Take 1 capsule (500 mg total) by mouth 4 (four) times daily.   28 capsule   0   . oxyCODONE-acetaminophen (PERCOCET/ROXICET) 5-325 MG per tablet   Oral   Take 1 tablet by mouth every 4 (four) hours as needed for pain.   20 tablet   0    BP 135/93  Pulse 97  Temp(Src) 98.3 F (36.8 C) (Oral)  Resp 20  Ht 5\' 8"  (1.727 m)  Wt 175 lb (79.379 kg)  BMI 26.61 kg/m2  SpO2 99% Physical Exam  Constitutional: He appears well-developed and well-nourished. No distress.  HENT:  Head: Normocephalic.  Neck: Neck supple.  Cardiovascular: Normal rate.   Pulmonary/Chest: Effort normal. He has no wheezes.  Musculoskeletal: Normal range of motion. He exhibits no edema.  Skin: There is erythema.  2 cm induration right mid lateral thigh, tender,  Central nondraining pustule.  4 cm surrounding erythema. No red streaking.  Right forearm abscess appears healing well, no continued sign of infection.  ED Course   Procedures (including critical care time)   INCISION AND DRAINAGE Performed by: Burgess Amor Consent: Verbal consent obtained. Risks and benefits: risks, benefits and alternatives were discussed Type: abscess  Body area: right thigh Anesthesia: local infiltration  Incision was made with a scalpel.  Local anesthetic: lidocaine 1% without epinephrine  Anesthetic total: 5 ml  Complexity: complex Blunt dissection to break up loculations  Drainage: purulent  Drainage amount: moderate  Packing material: 1/4 in iodoform gauze  Patient tolerance: Patient tolerated the procedure well with no immediate complications.     Labs Reviewed  CULTURE, ROUTINE-ABSCESS   No  results found. 1. Abscess of right thigh     MDM  Pt encouraged to continue with septra,  Added keflex.  Discussed doxy in place of septra, limited by cost.  Encouraged warm compresses,  Remove packing tomorrow, pt agreeable.  Return here for recheck for any worsened sx.  Burgess Amor, PA-C 01/18/13 1402

## 2013-01-20 LAB — CULTURE, ROUTINE-ABSCESS

## 2013-01-21 ENCOUNTER — Telehealth (HOSPITAL_COMMUNITY): Payer: Self-pay | Admitting: Emergency Medicine

## 2013-01-21 NOTE — ED Notes (Signed)
Post ED Visit - Positive Culture Follow-up: Successful Patient Follow-Up  Culture assessed and recommendations reviewed by: []  Wes Dulaney, Pharm.D., BCPS []  Celedonio Miyamoto, Pharm.D., BCPS []  Georgina Pillion, Pharm.D., BCPS []  [place], [address].D., BCPS, AAHIVP []  Estella Husk, Pharm.D., BCPS, AAHIVP  [x]  Abran Duke, [address].D.  Positive abscess culture  [x]  Patient discharged without antimicrobial prescription and treatment is now indicated []  Organism is resistant to prescribed ED discharge antimicrobial []  Patient with positive blood cultures  Changes discussed with ED provider: Rhea Bleacher PA-C New antibiotic prescription: Finish current Rx of Bactrim. Stop Keflex and start Doxycycline 100 mg PO twice daily x 7 days.    Kylie A Holland 01/21/2013, 12:37 PM

## 2013-01-21 NOTE — Progress Notes (Signed)
ED Antimicrobial Stewardship Positive Culture Follow Up   Christian Rose is an 23 y.o. male who presented to [facility] on 01/17/2013 with a chief complaint of  Chief Complaint  Patient presents with  . Abscess    Recent Results (from the past 720 hour(s))  CULTURE, ROUTINE-ABSCESS     Status: None   Collection Time    01/17/13 12:19 PM      Result Value Range Status   Specimen Description ABSCESS RIGHT THIGH   Final   Special Requests NONE   Final   Gram Stain     Final   Value: FEW WBC PRESENT, PREDOMINANTLY PMN     NO SQUAMOUS EPITHELIAL CELLS SEEN     FEW GRAM POSITIVE COCCI     IN PAIRS IN CLUSTERS     Performed at [facility]   Culture     Final   Value: MODERATE METHICILLIN RESISTANT STAPHYLOCOCCUS AUREUS     Note: RIFAMPIN AND GENTAMICIN SHOULD NOT BE USED AS SINGLE DRUGS FOR TREATMENT OF STAPH INFECTIONS. This organism DOES NOT demonstrate inducible Clindamycin resistance in vitro.     Performed at [facility]   Report Status 01/20/2013 FINAL   Final   Organism ID, Bacteria METHICILLIN RESISTANT STAPHYLOCOCCUS AUREUS   Final    [x]  Treated with Keflex, organism resistant to prescribed antimicrobial []  Patient discharged originally without antimicrobial agent and treatment is now indicated  New antibiotic prescription: Doxycycline 100 mg PO twice daily for 7 days  ED Provider: Rhea Bleacher, PA-C  Abran Duke 01/21/2013, 11:45 AM Infectious Diseases Pharmacist Phone# [phone]

## 2013-01-23 NOTE — Progress Notes (Addendum)
[facility] ED Cm received a message from the pt, left at 1120 01/22/13 and a call from [facility] flow manager related to medication assistance Pt was d/c with keflex to be taken along with septra. Pt lab results returned and doxycycline to be added per pharmacist Pt requesting assistance Cm spoke with Tonya flow manager who states pt rx called in to [facility]  [phone] Cm returned a call to the number provided by pt as [phone] and spoke with his grandmother Nelva Bush who states the pt left out of the home but he generally uses [facility] at [address], [place], [place]. Cm instructed her to inform pt to call the pharmacy later in the day to see if medication is ready  1300 Spoke to [place] at [facility] in [place] and medication called in (per 01/23/13 pharmacy note) New antibiotic prescription: Doreatha Martin current Rx of Bactrim. Stop Keflex and start Doxycycline 100 mg PO twice daily x 7 days.  1315 Cm spoke with the pt to confirm he would [facility]  CM reviewed EPIC notes and chart review information CM spoke with the pt about [facility] MATCH program ($3 co pay for each Rx through [facility] program, 7 day expiration of MATCH letter and choice of pharmacies) Pt agreed to receive assistance from program   Pt confrims he has a upcoming ppointment at the free clinic in [place] county on 01/25/13  Pt is eligible for [facility] MATCH program (unable to find pt listed in PDMI per cardholder name inquiry) PDMI information entered. MATCH letter completed faxed to ATTN brittany at [facility] [phone] with fax confirmation received at [phone] Cm spoke with Florentina Addison at [facility] to informed of change in pharmacy per pt preference

## 2013-01-23 NOTE — Progress Notes (Signed)
                  Dear ____________[name]______________:  Bonita Quin have been approved to have your discharge prescriptions filled through our [facility] (Medication Assistance Through [facility]) program. This program allows for a one-time (no refills) 34-day supply of selected medications for a low copay amount.  The copay is $3.00 per prescription. For instance, if you have one prescription, you will pay $3.00; for two prescriptions, you pay $6.00; for three prescriptions, you pay $9.00; and so on.  Only certain pharmacies are participating in this program with [facility]. You will need to select one of the pharmacies from the attached list and take your prescriptions, this letter, and your photo ID to one of the participating pharmacies.   We are excited that you are able to use the [facility] program to get your medications. These prescriptions must be filled within 7 days of hospital discharge or they will no longer be valid for the [facility] program. Should you have any problems with your prescriptions please contact your case management team member at [phone].  Thank you,   [facility]   Participating [facility] Pharmacies  [facility]   [facility] [id number] [place], [place]   [facility] [address] [place], [place]   [facility] [address] [place], [place]   [facility]   [address] [place], [place]   [address] [place], [place], [place]   [address], [place], [place]   [address] [place], [place]   [address], [place], [place]   [address], [place], [place]   [address] [place], [place]   [address], [place], [place]   [address], [place], [place]   [address] [place], [place], [place]  [facility]   [address] [place], [place]   [address] [place] [place],  [place]   [address] [place], [place], [place]   [address] [place], [place]   [address] [place], [place]   [address] [place] [address] [place], [place], [place] [facility]   [address], [place], [place]   [address] [employer], [place], [place]   [address] [place], [place]   [address] [employer] [address], [place], [place]   [address], [place], [place]   [address], [place], [place]   [address], [place], [place]   [address], [place], [place]   [address]2 [address], [place], [place]   904 [address], [place], [place]   [address], [place], [place]   [address] [place], [place]   [address] [place], [place]   [address] [place], [place], [place]  Independent Pharmacies   [facility] [address] [place], [address] [place], [place]   [facility] [address] [place], [place]   c   For continued medication needs, please contact the [facility] Department at  [phone].

## 2013-04-30 ENCOUNTER — Encounter (HOSPITAL_COMMUNITY): Payer: Self-pay | Admitting: Emergency Medicine

## 2013-04-30 ENCOUNTER — Emergency Department (HOSPITAL_COMMUNITY)
Admission: EM | Admit: 2013-04-30 | Discharge: 2013-04-30 | Disposition: A | Discharge status: Home / Self Care | Payer: Self-pay | Attending: Emergency Medicine | Admitting: Emergency Medicine

## 2013-04-30 DIAGNOSIS — K5289 Other specified noninfective gastroenteritis and colitis: Secondary | ICD-10-CM | POA: Insufficient documentation

## 2013-04-30 DIAGNOSIS — Z8679 Personal history of other diseases of the circulatory system: Secondary | ICD-10-CM | POA: Insufficient documentation

## 2013-04-30 DIAGNOSIS — F17200 Nicotine dependence, unspecified, uncomplicated: Secondary | ICD-10-CM | POA: Insufficient documentation

## 2013-04-30 DIAGNOSIS — Z79899 Other long term (current) drug therapy: Secondary | ICD-10-CM | POA: Insufficient documentation

## 2013-04-30 DIAGNOSIS — K529 Noninfective gastroenteritis and colitis, unspecified: Secondary | ICD-10-CM

## 2013-04-30 DIAGNOSIS — R51 Headache: Secondary | ICD-10-CM | POA: Insufficient documentation

## 2013-04-30 DIAGNOSIS — Z792 Long term (current) use of antibiotics: Secondary | ICD-10-CM | POA: Insufficient documentation

## 2013-04-30 DIAGNOSIS — Z872 Personal history of diseases of the skin and subcutaneous tissue: Secondary | ICD-10-CM | POA: Insufficient documentation

## 2013-04-30 LAB — URINE MICROSCOPIC-ADD ON

## 2013-04-30 LAB — CBC WITH DIFFERENTIAL/PLATELET
Basophils Absolute: 0 10*3/uL (ref 0.0–0.1)
Eosinophils Absolute: 0.1 10*3/uL (ref 0.0–0.7)
Lymphs Abs: 0.8 10*3/uL (ref 0.7–4.0)
MCH: 29.8 pg (ref 26.0–34.0)
Neutrophils Relative %: 88 % — ABNORMAL HIGH (ref 43–77)
Platelets: 149 10*3/uL — ABNORMAL LOW (ref 150–400)
RBC: 5.71 MIL/uL (ref 4.22–5.81)
WBC: 11.1 10*3/uL — ABNORMAL HIGH (ref 4.0–10.5)

## 2013-04-30 LAB — URINALYSIS, ROUTINE W REFLEX MICROSCOPIC
Leukocytes, UA: NEGATIVE
Nitrite: NEGATIVE
Specific Gravity, Urine: 1.02 (ref 1.005–1.030)
Urobilinogen, UA: 0.2 mg/dL (ref 0.0–1.0)

## 2013-04-30 LAB — HEPATIC FUNCTION PANEL
Bilirubin, Direct: 0.2 mg/dL (ref 0.0–0.3)
Indirect Bilirubin: 0.6 mg/dL (ref 0.3–0.9)
Total Protein: 8.3 g/dL (ref 6.0–8.3)

## 2013-04-30 LAB — BASIC METABOLIC PANEL
GFR calc Af Amer: >90 mL/min (ref >90)
GFR calc non Af Amer: >90 mL/min (ref >90)
Glucose, Bld: 111 mg/dL — ABNORMAL HIGH (ref 70–99)
Potassium: 3.7 mEq/L (ref 3.5–5.1)
Sodium: 137 mEq/L (ref 135–145)

## 2013-04-30 LAB — LIPASE, BLOOD: Lipase: 25 U/L (ref 11–59)

## 2013-04-30 MED ORDER — SODIUM CHLORIDE 0.9 % IV BOLUS (SEPSIS)
1000.0000 mL | Freq: Once | INTRAVENOUS | Status: AC
  Administered 2013-04-30: 1000 mL via INTRAVENOUS

## 2013-04-30 MED ORDER — ONDANSETRON 8 MG PO TBDP
8.0000 mg | ORAL_TABLET | Freq: Once | ORAL | Status: AC
  Administered 2013-04-30: 8 mg via ORAL
  Filled 2013-04-30: qty 1

## 2013-04-30 MED ORDER — PROMETHAZINE HCL 25 MG/ML IJ SOLN
12.5000 mg | Freq: Once | INTRAMUSCULAR | Status: AC
  Administered 2013-04-30: 12.5 mg via INTRAVENOUS
  Filled 2013-04-30: qty 1

## 2013-04-30 MED ORDER — PROMETHAZINE HCL 25 MG PO TABS: 25.0000 mg | ORAL_TABLET | Freq: Four times a day (QID) | ORAL | Status: DC | PRN

## 2013-04-30 MED ORDER — KETOROLAC TROMETHAMINE 30 MG/ML IJ SOLN
30.0000 mg | Freq: Once | INTRAMUSCULAR | Status: AC
  Administered 2013-04-30: 30 mg via INTRAVENOUS
  Filled 2013-04-30: qty 1

## 2013-04-30 NOTE — ED Notes (Signed)
Pt alert & oriented x4, stable gait. Patient given discharge instructions, paperwork & prescription(s). Patient  instructed to stop at the registration desk to finish any additional paperwork. Patient verbalized understanding. Pt left department w/ no further questions. 

## 2013-04-30 NOTE — ED Notes (Signed)
Onset yesterday with abd pain, Now abd pain, NVD. Christian Rose

## 2013-04-30 NOTE — ED Provider Notes (Signed)
CSN: [id number]     Arrival date & time 04/30/13  1317 History  This chart was scribed for Geoffery Lyons, MD by Bennett Scrape, ED Scribe. This patient was seen in room APA10/APA10 and the patient's care was started at 3:01 PM.   Chief Complaint  Patient presents with  . Emesis    The history is provided by the patient. No language interpreter was used.    HPI Comments: Christian Rose is a 22 y.o. male who presents to the Emergency Department complaining of gradual onset, gradually worsening, constant, periumbilical abdominal pain described as cramping that started yesterday with associated HA, nausea, emesis and diarrhea that started this morning. Last episode of emesis was in the ED. He admits to having several sick contacts at home with similar symptoms. He denies any urinary symptoms or known fevers. He denies any prior abdominal surgeries or chronic abdominal symptoms.  Past Medical History  Diagnosis Date  . 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 Penile abscess    Past Surgical History  Procedure Laterality Date  . Hand surgery    . Irrigation and debridement abscess  09/14/2011    Procedure: IRRIGATION AND DEBRIDEMENT ABSCESS;  Surgeon: Ky Barban, MD;  Location: [facility];  Service: Urology;  Laterality: N/A;  Incision and drainage of penile abscess   Family History  Problem Relation Age of Onset  . Diabetes Mother   . Schizophrenia Father    History  Substance Use Topics  . Smoking status: Current Every Day Smoker -- 0.50 packs/day for 8 years    Types: Cigarettes  . Smokeless tobacco: Not on file  . Alcohol Use: 4.0 oz/week    8 drink(s) per week     Comment: occ    Review of Systems  Constitutional: Negative for fever.  Gastrointestinal: Positive for nausea, vomiting, abdominal pain and diarrhea.  Genitourinary: Negative for dysuria, frequency and difficulty urinating.  All other systems reviewed and are negative.    Allergies  Review of patient's allergies  indicates no known allergies.  Home Medications   Current Outpatient Rx  Name  Route  Sig  Dispense  Refill  . cephALEXin (KEFLEX) 500 MG capsule   Oral   Take 1 capsule (500 mg total) by mouth 4 (four) times daily.   28 capsule   0   . HYDROcodone-acetaminophen (NORCO/VICODIN) 5-325 MG per tablet   Oral   Take 1 tablet by mouth every 4 (four) hours as needed.   15 tablet   0   . oxyCODONE-acetaminophen (PERCOCET/ROXICET) 5-325 MG per tablet   Oral   Take 1 tablet by mouth every 4 (four) hours as needed for pain.   20 tablet   0   . sulfamethoxazole-trimethoprim (SEPTRA DS) 800-160 MG per tablet   Oral   Take 1 tablet by mouth 2 (two) times daily.   14 tablet   0    Triage Vitals: BP 130/80  Pulse 102  Temp(Src) 98.1 F (36.7 C) (Oral)  Resp 20  Ht 5\' 8"  (1.727 m)  Wt 220 lb (99.791 kg)  BMI 33.46 kg/m2  SpO2 99%  Physical Exam  Nursing note and vitals reviewed. Constitutional: He is oriented to person, place, and time. He appears well-developed and well-nourished. No distress.  HENT:  Head: Normocephalic and atraumatic.  Mouth/Throat: Oropharynx is clear and moist.  Moist MM  Eyes: Conjunctivae and EOM are normal. Pupils are equal, round, and reactive to light.  Neck: Normal range of motion. Neck supple. No tracheal  deviation present.  Cardiovascular: Normal rate, regular rhythm and normal heart sounds.   No murmur heard. Pulmonary/Chest: Effort normal and breath sounds normal. No respiratory distress. He has no wheezes. He has no rales.  Abdominal: Soft. Bowel sounds are normal. There is tenderness (mild generalized tenderness to palpation). There is no rebound and no guarding.  No CVA tenderness   Musculoskeletal: Normal range of motion. He exhibits no edema.  Neurological: He is alert and oriented to person, place, and time. No cranial nerve deficit.  Skin: Skin is warm and dry.  Psychiatric: He has a normal mood and affect. His behavior is normal.     ED Course  Procedures (including critical care time)  Medications  sodium chloride 0.9 % bolus 1,000 mL (not administered)  ketorolac (TORADOL) 30 MG/ML injection 30 mg (not administered)  promethazine (PHENERGAN) injection 12.5 mg (not administered)  ondansetron (ZOFRAN-ODT) disintegrating tablet 8 mg (8 mg Oral Given 04/30/13 1501)    DIAGNOSTIC STUDIES: Oxygen Saturation is 99% on room air, normal by my interpretation.    COORDINATION OF CARE: 3:05 PM-Advised pt that his symptoms are most likely a virus that he contracted from his family members. Discussed treatment plan which includes medications, CBC panel, BMP and UA with pt at bedside and pt agreed to plan.   Labs Review Labs Reviewed  BASIC METABOLIC PANEL - Abnormal; Notable for the following:    Glucose, Bld 111 (*)    All other components within normal limits  CBC WITH DIFFERENTIAL  URINALYSIS, ROUTINE W REFLEX MICROSCOPIC  PREGNANCY, URINE   Imaging Review No results found.    MDM  No diagnosis found. Patient is a 22 year old male otherwise healthy presents with complaints of vomiting and diarrhea. His presentation, exam, and workup are consistent with a viral etiology. There are several members of his household that are ill in a similar fashion and he has had no bloody stool or emesis. He was given intravenous fluids, anti-inflammatories, and anti-medics and is feeling better. At this point I feel as though he is stable for discharge. He will be given Phenergan to be used for his nausea. He has a mildly elevated white count, however I feel this is related to the viral process. He has no right lower quadrant tenderness to palpation and I have very low suspicion for appendicitis. He does understand to return if he develops right lower quadrant pain, high fever, or bloody stool.  I personally performed the services described in this documentation, which was scribed in my presence. The recorded information has been  reviewed and is accurate.      Geoffery Lyons, MD 04/30/13 [phone]

## 2013-04-30 NOTE — ED Notes (Signed)
Pt states everyone in his house has been vomiting, he started vomiting this morning, has had 5 episodes of same. Pt states he still feels nauseated.

## 2013-06-05 ENCOUNTER — Emergency Department (HOSPITAL_COMMUNITY)
Admission: EM | Admit: 2013-06-05 | Discharge: 2013-06-05 | Disposition: A | Discharge status: Home / Self Care | Payer: Self-pay | Attending: Emergency Medicine | Admitting: Emergency Medicine

## 2013-06-05 ENCOUNTER — Encounter (HOSPITAL_COMMUNITY): Payer: Self-pay | Admitting: Emergency Medicine

## 2013-06-05 DIAGNOSIS — N611 Abscess of the breast and nipple: Secondary | ICD-10-CM

## 2013-06-05 DIAGNOSIS — N61 Mastitis without abscess: Secondary | ICD-10-CM | POA: Insufficient documentation

## 2013-06-05 DIAGNOSIS — Z79899 Other long term (current) drug therapy: Secondary | ICD-10-CM | POA: Insufficient documentation

## 2013-06-05 DIAGNOSIS — F17200 Nicotine dependence, unspecified, uncomplicated: Secondary | ICD-10-CM | POA: Insufficient documentation

## 2013-06-05 DIAGNOSIS — L0390 Cellulitis, unspecified: Secondary | ICD-10-CM

## 2013-06-05 DIAGNOSIS — A4902 Methicillin resistant Staphylococcus aureus infection, unspecified site: Secondary | ICD-10-CM | POA: Insufficient documentation

## 2013-06-05 DIAGNOSIS — Z8679 Personal history of other diseases of the circulatory system: Secondary | ICD-10-CM | POA: Insufficient documentation

## 2013-06-05 DIAGNOSIS — Z87448 Personal history of other diseases of urinary system: Secondary | ICD-10-CM | POA: Insufficient documentation

## 2013-06-05 MED ORDER — IBUPROFEN 600 MG PO TABS: 600.0000 mg | ORAL_TABLET | Freq: Four times a day (QID) | ORAL | Status: DC | PRN

## 2013-06-05 MED ORDER — SULFAMETHOXAZOLE-TRIMETHOPRIM 800-160 MG PO TABS: 1.0000 | ORAL_TABLET | Freq: Two times a day (BID) | ORAL | Status: DC

## 2013-06-05 NOTE — ED Provider Notes (Signed)
CSN: [id number]     Arrival date & time 06/05/13  1019 History   First MD Initiated Contact with Patient 06/05/13 1036     Chief Complaint  Patient presents with  . Abscess   (Consider location/radiation/quality/duration/timing/severity/associated sxs/prior Treatment) Patient is a 22 y.o. male presenting with abscess. The history is provided by the patient.  Abscess Location:  Torso Torso abscess location:  R chest Size:  1 cm Abscess quality: induration, painful and redness   Red streaking: no   Duration:  2 days Progression:  Worsening Pain details:    Quality:  Sharp   Timing:  Constant   Progression:  Worsening Chronicity:  Recurrent Context comment:  Christian Rose Relieved by:  None tried Worsened by:  Nothing tried Ineffective treatments:  None tried Associated symptoms: no fever and no nausea   Risk factors: prior abscess     Past Medical History  Diagnosis Date  . 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 Penile abscess    Past Surgical History  Procedure Laterality Date  . Hand surgery    . Irrigation and debridement abscess  09/14/2011    Procedure: IRRIGATION AND DEBRIDEMENT ABSCESS;  Surgeon: Ky Barban, MD;  Location: [facility];  Service: Urology;  Laterality: N/A;  Incision and drainage of penile abscess   Family History  Problem Relation Age of Onset  . Diabetes Mother   . Schizophrenia Father    History  Substance Use Topics  . Smoking status: Current Every Day Smoker -- 0.50 packs/day for 8 years    Types: Cigarettes  . Smokeless tobacco: Not on file  . Alcohol Use: 4.0 oz/week    8 drink(s) per week     Comment: occ    Review of Systems  Constitutional: Negative for fever and chills.  HENT: Negative.   Respiratory: Negative for shortness of breath and wheezing.   Gastrointestinal: Negative for nausea and abdominal pain.  Skin: Positive for color change.  Neurological: Negative for numbness.    Allergies  Review of patient's allergies indicates no known  allergies.  Home Medications   Current Outpatient Rx  Name  Route  Sig  Dispense  Refill  . ibuprofen (ADVIL,MOTRIN) 600 MG tablet   Oral   Take 1 tablet (600 mg total) by mouth every 6 (six) hours as needed.   30 tablet   0   . sulfamethoxazole-trimethoprim (SEPTRA DS) 800-160 MG per tablet   Oral   Take 1 tablet by mouth 2 (two) times daily.   28 tablet   0    BP 143/89  Pulse 92  Temp(Src) 98.4 F (36.9 C) (Oral)  Resp 16  Ht 5\' 8"  (1.727 m)  Wt 210 lb (95.255 kg)  BMI 31.94 kg/m2  SpO2 100% Physical Exam  Constitutional: He appears well-developed and well-nourished. No distress.  HENT:  Head: Normocephalic.  Neck: Neck supple.  Cardiovascular: Normal rate.   Pulmonary/Chest: Effort normal. He has no wheezes.  Musculoskeletal: Normal range of motion. He exhibits no edema.  Skin: There is erythema.  1 cm indurated lesion right breast near nipple.  There is no fluctuance and no active drainage from the site.  No red streaking.    ED Course  Procedures (including critical care time) Labs Review Labs Reviewed - No data to display Imaging Review No results found.  EKG Interpretation   None       MDM   1. Abscess of breast   2. Cellulitis      Bedside ultrasound performed  with no abscess pocket identified.  Patient will be prescribed Bactrim, encouraged in warm soaks twice daily.  Patient has a history of frequent abscesses and is Christian Rose positive.  Encouraged recheck here for any worsened symptoms or if not improving over the next week.    Burgess Amor, PA-C 06/05/13 1313

## 2013-06-05 NOTE — ED Notes (Signed)
Abscess to right breast x 2 days.

## 2013-06-06 NOTE — ED Provider Notes (Signed)
Medical screening examination/treatment/procedure(s) were performed by non-physician practitioner and as supervising physician I was immediately available for consultation/collaboration.  EKG Interpretation   None         Leanah Kolander W. Chastelyn [place], MD 06/06/13 1927 

## 2013-06-17 ENCOUNTER — Emergency Department (HOSPITAL_COMMUNITY)
Admission: EM | Admit: 2013-06-17 | Discharge: 2013-06-17 | Disposition: A | Discharge status: Home / Self Care | Payer: Self-pay | Attending: Emergency Medicine | Admitting: Emergency Medicine

## 2013-06-17 ENCOUNTER — Encounter (HOSPITAL_COMMUNITY): Payer: Self-pay | Admitting: Emergency Medicine

## 2013-06-17 DIAGNOSIS — L02213 Cutaneous abscess of chest wall: Secondary | ICD-10-CM

## 2013-06-17 DIAGNOSIS — L02219 Cutaneous abscess of trunk, unspecified: Secondary | ICD-10-CM | POA: Insufficient documentation

## 2013-06-17 DIAGNOSIS — Z87448 Personal history of other diseases of urinary system: Secondary | ICD-10-CM | POA: Insufficient documentation

## 2013-06-17 DIAGNOSIS — Z8679 Personal history of other diseases of the circulatory system: Secondary | ICD-10-CM | POA: Insufficient documentation

## 2013-06-17 DIAGNOSIS — F17200 Nicotine dependence, unspecified, uncomplicated: Secondary | ICD-10-CM | POA: Insufficient documentation

## 2013-06-17 DIAGNOSIS — L03319 Cellulitis of trunk, unspecified: Principal | ICD-10-CM

## 2013-06-17 MED ORDER — HYDROCODONE-ACETAMINOPHEN 5-325 MG PO TABS
2.0000 | ORAL_TABLET | Freq: Once | ORAL | Status: AC
  Administered 2013-06-17: 2 via ORAL
  Filled 2013-06-17: qty 2

## 2013-06-17 MED ORDER — BACITRACIN-NEOMYCIN-POLYMYXIN 400-5-5000 EX OINT
TOPICAL_OINTMENT | CUTANEOUS | Status: AC
  Administered 2013-06-17: 13:00:00
  Filled 2013-06-17: qty 1

## 2013-06-17 MED ORDER — AMOXICILLIN-POT CLAVULANATE 875-125 MG PO TABS
1.0000 | ORAL_TABLET | Freq: Once | ORAL | Status: AC
  Administered 2013-06-17: 1 via ORAL
  Filled 2013-06-17: qty 1

## 2013-06-17 MED ORDER — SULFAMETHOXAZOLE-TRIMETHOPRIM 800-160 MG PO TABS: 1.0000 | ORAL_TABLET | Freq: Two times a day (BID) | ORAL | Status: AC

## 2013-06-17 MED ORDER — SULFAMETHOXAZOLE-TMP DS 800-160 MG PO TABS
1.0000 | ORAL_TABLET | Freq: Once | ORAL | Status: AC
  Administered 2013-06-17: 1 via ORAL
  Filled 2013-06-17: qty 1

## 2013-06-17 MED ORDER — HYDROCODONE-ACETAMINOPHEN 7.5-325 MG PO TABS: 1.0000 | ORAL_TABLET | ORAL | Status: DC | PRN

## 2013-06-17 NOTE — ED Provider Notes (Signed)
Medical screening examination/treatment/procedure(s) were performed by non-physician practitioner and as supervising physician I was immediately available for consultation/collaboration.  EKG Interpretation   None         Joya Gaskinsonald W Kodie Kishi, MD 06/17/13 1330

## 2013-06-17 NOTE — ED Notes (Addendum)
?  abcess to epigastric area for two days, has been draining liquid that is yellow and green per pt. States that he first noticed that area yesterday when it started itching,

## 2013-06-17 NOTE — Discharge Instructions (Signed)
Please soak your chest abscess area in Epsom salt water daily for 15-20 minutes until the wound heals. Please use Bactrim 2 times daily until all taken. Use Tylenol or ibuprofen for mild discomfort. May use Norco for more severe discomfort. Norco may cause drowsiness, please use with caution. Abscess An abscess (boil or furuncle) is an infected area on or under the skin. This area is filled with yellowish-white fluid (pus) and other material (debris). HOME CARE   Only take medicines as told by your doctor.  If you were given antibiotic medicine, take it as directed. Finish the medicine even if you start to feel better.  If gauze is used, follow your doctor's directions for changing the gauze.  To avoid spreading the infection:  Keep your abscess covered with a bandage.  Wash your hands well.  Do not share personal care items, towels, or whirlpools with others.  Avoid skin contact with others.  Keep your skin and clothes clean around the abscess.  Keep all doctor visits as told. GET HELP RIGHT AWAY IF:   You have more pain, puffiness (swelling), or redness in the wound site.  You have more fluid or blood coming from the wound site.  You have muscle aches, chills, or you feel sick.  You have a fever. MAKE SURE YOU:   Understand these instructions.  Will watch your condition.  Will get help right away if you are not doing well or get worse. Document Released: 11/10/2007 Document Revised: 11/23/2011 Document Reviewed: 08/06/2011 [facility] Patient Information 2014 [place]ExitCare, [place].

## 2013-06-17 NOTE — Progress Notes (Signed)
ED/CM noted patient did not have health insurance and/or PCP listed in the computer.  Patient was given the [place] resource handout with information on the clinics, food pantries, and the handout for new health insurance sign-up.  Patient expressed appreciation for information received. 

## 2013-06-17 NOTE — ED Provider Notes (Signed)
CSN: [id number]     Arrival date & time 06/17/13  1122 History   First MD Initiated Contact with Patient 06/17/13 1124     Chief Complaint  Patient presents with  . Abscess   (Consider location/radiation/quality/duration/timing/severity/associated sxs/prior Treatment) Patient is a 23 y.o. male presenting with abscess. The history is provided by the patient.  Abscess Location:  Torso Torso abscess location:  L chest Abscess quality: painful, redness and weeping   Red streaking: no   Duration:  1 day Progression:  Worsening Pain details:    Quality:  Aching and pressure   Severity:  Moderate   Duration:  1 day   Timing:  Constant   Progression:  Worsening Chronicity:  Recurrent Context: not diabetes and not immunosuppression   Relieved by:  Nothing Ineffective treatments:  None tried Associated symptoms: no anorexia, no fever and no nausea   Risk factors: hx of [name] and prior abscess     Past Medical History  Diagnosis Date  . 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 Penile abscess    Past Surgical History  Procedure Laterality Date  . Hand surgery    . Irrigation and debridement abscess  09/14/2011    Procedure: IRRIGATION AND DEBRIDEMENT ABSCESS;  Surgeon: Ky BarbanMohammad I Javaid, MD;  Location: [facility];  Service: Urology;  Laterality: N/A;  Incision and drainage of penile abscess   Family History  Problem Relation Age of Onset  . Diabetes Mother   . Schizophrenia Father    History  Substance Use Topics  . Smoking status: Current Every Day Smoker -- 0.50 packs/day for 8 years    Types: Cigarettes  . Smokeless tobacco: Not on file  . Alcohol Use: 4.0 oz/week    8 drink(s) per week     Comment: occ    Review of Systems  Constitutional: Negative for fever and activity change.       All ROS Neg except as noted in HPI  HENT: Negative for nosebleeds.   Eyes: Negative for photophobia and discharge.  Respiratory: Negative for cough, shortness of breath and wheezing.   Cardiovascular:  Negative for chest pain and palpitations.  Gastrointestinal: Negative for nausea, abdominal pain, blood in stool and anorexia.  Genitourinary: Negative for dysuria, frequency and hematuria.  Musculoskeletal: Negative for arthralgias, back pain and neck pain.  Skin: Positive for wound.  Neurological: Negative for dizziness, seizures and speech difficulty.  Psychiatric/Behavioral: Negative for hallucinations and confusion.    Allergies  Review of patient's allergies indicates no known allergies.  Home Medications   Current Outpatient Rx  Name  Route  Sig  Dispense  Refill  . ibuprofen (ADVIL,MOTRIN) 600 MG tablet   Oral   Take 1 tablet (600 mg total) by mouth every 6 (six) hours as needed.   30 tablet   0    BP 138/90  Pulse 103  Temp(Src) 98.4 F (36.9 C) (Oral)  Resp 20  SpO2 98% Physical Exam  Nursing note and vitals reviewed. Constitutional: He is oriented to person, place, and time. He appears well-developed and well-nourished.  Non-toxic appearance.  HENT:  Head: Normocephalic.  Right Ear: Tympanic membrane and external ear normal.  Left Ear: Tympanic membrane and external ear normal.  Eyes: EOM and lids are normal. Pupils are equal, round, and reactive to light.  Neck: Normal range of motion. Neck supple. Carotid bruit is not present.  Cardiovascular: Normal rate, regular rhythm, normal heart sounds, intact distal pulses and normal pulses.   Pulmonary/Chest: Breath sounds normal. No  respiratory distress.    Abdominal: Soft. Bowel sounds are normal. There is no tenderness. There is no guarding.  Musculoskeletal: Normal range of motion.  Lymphadenopathy:       Head (right side): No submandibular adenopathy present.       Head (left side): No submandibular adenopathy present.    He has no cervical adenopathy.  Neurological: He is alert and oriented to person, place, and time. He has normal strength. No cranial nerve deficit or sensory deficit.  Skin: Skin is warm  and dry.  Psychiatric: He has a normal mood and affect. His speech is normal.    ED Course  INCISION AND DRAINAGE Date/Time: 06/17/2013 12:40 PM Performed by: Kathie Dike Authorized by: Kathie Dike Consent: Verbal consent obtained. Risks and benefits: risks, benefits and alternatives were discussed Consent given by: patient Patient understanding: patient states understanding of the procedure being performed Patient identity confirmed: arm band Time out: Immediately prior to procedure a "time out" was called to verify the correct patient, procedure, equipment, support staff and site/side marked as required. Type: abscess Body area: trunk Location details: chest Local anesthetic: topical anesthetic Patient sedated: no Needle gauge: 18 Incision type: single straight Complexity: simple Drainage: purulent Drainage amount: scant Wound treatment: wound left open Patient tolerance: Patient tolerated the procedure well with no immediate complications.   (including critical care time) Labs Review Labs Reviewed - No data to display Imaging Review No results found.  EKG Interpretation   None       MDM  No diagnosis found. **I have reviewed nursing notes, vital signs, and all appropriate lab and imaging results for this patient.*   Patient presents to the emergency room with history of recurring abscesses. He has a history of methicillin-resistant staph arias. Incision and drainage was carried out for the abscess involving the anterior chest. Culture was sent to the lab. Prescription for Bactrim 2 times daily, and Norco every 4 hours as needed for pain given to the patient. Patient will begin warm tub soaks starting tomorrow. Patient is to return to the emergency department if any high fevers, problems or complications.  Kathie Dike, PA-C 06/17/13 1248

## 2013-06-20 ENCOUNTER — Telehealth (HOSPITAL_COMMUNITY): Payer: Self-pay

## 2013-06-20 LAB — CULTURE, ROUTINE-ABSCESS

## 2013-06-20 NOTE — ED Notes (Addendum)
[facility]  Abscess -> (+) moderate [name].  Rx given in ED for Sulfa-Trimeth -> sensitive to the same.  Call and notify pt.

## 2013-06-21 ENCOUNTER — Telehealth (HOSPITAL_BASED_OUTPATIENT_CLINIC_OR_DEPARTMENT_OTHER): Payer: Self-pay

## 2013-06-21 NOTE — Telephone Encounter (Signed)
Post ED Visit - Positive Culture Follow-up  Culture report reviewed by antimicrobial stewardship pharmacist: [x]  Wes Dulaney, Pharm.D., BCPS []  Celedonio MiyamotoJeremy Frens, Pharm.D., BCPS []  Georgina PillionElizabeth Martin, Pharm.D., BCPS []  UvaldeMinh Pham, [address] Rainbow BoulevardPharm.D., BCPS, AAHIVP []  Estella HuskMichelle Turner, Pharm.D., BCPS, AAHIVP  Positive Abscess culture [name] Treated with Septra DS, organism sensitive to the same and no further patient follow-up is required at this time.  Ashley JacobsFesterman, Davarion Cuffee C 06/21/2013, 10:59 AM

## 2013-06-27 NOTE — ED Notes (Signed)
Patient notified and educated to [name] precautions.

## 2013-07-29 ENCOUNTER — Encounter (HOSPITAL_COMMUNITY): Payer: Self-pay | Admitting: Emergency Medicine

## 2013-07-29 ENCOUNTER — Emergency Department (HOSPITAL_COMMUNITY)
Admission: EM | Admit: 2013-07-29 | Discharge: 2013-07-29 | Disposition: A | Discharge status: Home / Self Care | Payer: Self-pay | Attending: Emergency Medicine | Admitting: Emergency Medicine

## 2013-07-29 DIAGNOSIS — L02413 Cutaneous abscess of right upper limb: Secondary | ICD-10-CM

## 2013-07-29 DIAGNOSIS — F17200 Nicotine dependence, unspecified, uncomplicated: Secondary | ICD-10-CM | POA: Insufficient documentation

## 2013-07-29 DIAGNOSIS — IMO0002 Reserved for concepts with insufficient information to code with codable children: Secondary | ICD-10-CM | POA: Insufficient documentation

## 2013-07-29 MED ORDER — SULFAMETHOXAZOLE-TRIMETHOPRIM 800-160 MG PO TABS: 1.0000 | ORAL_TABLET | Freq: Two times a day (BID) | ORAL | Status: AC

## 2013-07-29 MED ORDER — LIDOCAINE HCL (PF) 1 % IJ SOLN
5.0000 mL | Freq: Once | INTRAMUSCULAR | Status: AC
  Administered 2013-07-29: 5 mL
  Filled 2013-07-29: qty 5

## 2013-07-29 MED ORDER — HYDROCODONE-ACETAMINOPHEN 5-325 MG PO TABS: 1.0000 | ORAL_TABLET | ORAL | Status: DC | PRN

## 2013-07-29 NOTE — ED Notes (Signed)
Pt alert & oriented x4, stable gait. Patient given discharge instructions, paperwork & prescription(s). Patient  instructed to stop at the registration desk to finish any additional paperwork. Patient verbalized understanding. Pt left department w/ no further questions. 

## 2013-07-29 NOTE — ED Notes (Signed)
Patient has an abscess noted to right forearm. States it "popped up just this morning."

## 2013-07-29 NOTE — Discharge Instructions (Signed)
Apply warm wet compresses to the area 4 times a day. Take the antibiotics as directed. Do not take the narcotic pain medication if you are driving as it will make you sleepy.  Return for worsening symptoms.

## 2013-07-29 NOTE — ED Notes (Signed)
Wound covered w/ sterile dressing.

## 2013-07-29 NOTE — ED Provider Notes (Signed)
CSN: [id number]     Arrival date & time 07/29/13  1754 History   First MD Initiated Contact with Patient 07/29/13 1808     Chief Complaint  Patient presents with  . Abscess     (Consider location/radiation/quality/duration/timing/severity/associated sxs/prior Treatment) Patient is a 23 y.o. male presenting with abscess. The history is provided by the patient.  Abscess Location:  Shoulder/arm Shoulder/arm abscess location:  R forearm Abscess quality: painful, redness and warmth   Red streaking: yes   Duration:  8 hours Progression:  Worsening Pain details:    Quality:  Sharp   Severity:  Moderate Chronicity:  New Relieved by:  None tried Worsened by:  Nothing tried Ineffective treatments:  None tried  Christian Rose is a 23 y.o. male who presents to the ED with an abscess to the right forearm that he first noticed this morning. He states that since he first noticed the area it has gotten larger and now has red streaks.   Past Medical History  Diagnosis Date  . 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 Penile abscess    Past Surgical History  Procedure Laterality Date  . Hand surgery    . Irrigation and debridement abscess  09/14/2011    Procedure: IRRIGATION AND DEBRIDEMENT ABSCESS;  Surgeon: Ky BarbanMohammad I Javaid, MD;  Location: [facility];  Service: Urology;  Laterality: N/A;  Incision and drainage of penile abscess   Family History  Problem Relation Age of Onset  . Diabetes Mother   . Schizophrenia Father    History  Substance Use Topics  . Smoking status: Current Every Day Smoker -- 0.50 packs/day for 8 years    Types: Cigarettes  . Smokeless tobacco: Not on file  . Alcohol Use: 4.0 oz/week    8 drink(s) per week     Comment: occ    Review of Systems Negative except as stated in HPI   Allergies  Review of patient's allergies indicates no known allergies.  Home Medications  No current outpatient prescriptions on file. BP 151/84  Pulse 91  Temp(Src) 98.9 F (37.2 C) (Oral)   Resp 24  Ht 5' 8.5" (1.74 m)  Wt 220 lb (99.791 kg)  BMI 32.96 kg/m2  SpO2 99% Physical Exam  Nursing note and vitals reviewed. Constitutional: He is oriented to person, place, and time. He appears well-developed and well-nourished.  Eyes: EOM are normal.  Neck: Neck supple.  Pulmonary/Chest: Effort normal.  Abdominal: Soft. There is no tenderness.  Musculoskeletal: Normal range of motion.       Right forearm: He exhibits tenderness and swelling.       Arms: Tender swollen raised area palmar aspect of the right forearm with red streaks noted.   Neurological: He is alert and oriented to person, place, and time. No cranial nerve deficit.  Skin: Skin is warm and dry.    ED Course  Procedures INCISION AND DRAINAGE Performed by: NEESE,HOPE Consent: Verbal consent obtained. Risks and benefits: risks, benefits and alternatives were discussed Type: abscess  Body area: right forearm Anesthesia: local infiltration Cleaned with betadine  Local anesthetic: lidocaine 1% without epinephrine  Anesthetic total: 2 ml  Incision made with #11 blade  Complexity: complex Blunt dissection to break up loculations  Drainage: purulent  Drainage amount: small  Patient tolerance: Patient tolerated the procedure well with no immediate complications.  MDM  23 y.o. male with abscess to the right forearm. Stable for discharge. Will treat with antibiotics since there is streaking noted up the forearm. Discussed with  the patient and all questioned fully answered. He will return if problems arise.   [address] [place], [place] 07/29/13 [phone]

## 2013-07-30 NOTE — ED Provider Notes (Signed)
Medical screening examination/treatment/procedure(s) were performed by non-physician practitioner and as supervising physician I was immediately available for consultation/collaboration.  Tomoki Lucken M Vonya Ohalloran, MD 07/30/13 1348 

## 2013-10-19 ENCOUNTER — Emergency Department (HOSPITAL_COMMUNITY)
Admission: EM | Admit: 2013-10-19 | Discharge: 2013-10-19 | Disposition: A | Discharge status: Home / Self Care | Payer: Self-pay | Attending: Emergency Medicine | Admitting: Emergency Medicine

## 2013-10-19 ENCOUNTER — Encounter (HOSPITAL_COMMUNITY): Payer: Self-pay | Admitting: Emergency Medicine

## 2013-10-19 DIAGNOSIS — N61 Mastitis without abscess: Secondary | ICD-10-CM | POA: Insufficient documentation

## 2013-10-19 DIAGNOSIS — Z8679 Personal history of other diseases of the circulatory system: Secondary | ICD-10-CM | POA: Insufficient documentation

## 2013-10-19 DIAGNOSIS — F17200 Nicotine dependence, unspecified, uncomplicated: Secondary | ICD-10-CM | POA: Insufficient documentation

## 2013-10-19 DIAGNOSIS — N611 Abscess of the breast and nipple: Secondary | ICD-10-CM

## 2013-10-19 MED ORDER — CEFTRIAXONE SODIUM 1 G IJ SOLR
1.0000 g | Freq: Once | INTRAMUSCULAR | Status: AC
  Administered 2013-10-19: 1 g via INTRAMUSCULAR
  Filled 2013-10-19: qty 10

## 2013-10-19 MED ORDER — KETOROLAC TROMETHAMINE 10 MG PO TABS
10.0000 mg | ORAL_TABLET | Freq: Once | ORAL | Status: AC
  Administered 2013-10-19: 10 mg via ORAL
  Filled 2013-10-19: qty 1

## 2013-10-19 MED ORDER — LIDOCAINE HCL (PF) 1 % IJ SOLN
INTRAMUSCULAR | Status: AC
  Administered 2013-10-19: 2.1 mL via INTRAMUSCULAR
  Filled 2013-10-19: qty 5

## 2013-10-19 MED ORDER — OXYCODONE-ACETAMINOPHEN 5-325 MG PO TABS: 1.0000 | ORAL_TABLET | Freq: Four times a day (QID) | ORAL | Status: DC | PRN

## 2013-10-19 MED ORDER — DOXYCYCLINE HYCLATE 100 MG PO TABS
100.0000 mg | ORAL_TABLET | Freq: Once | ORAL | Status: AC
  Administered 2013-10-19: 100 mg via ORAL
  Filled 2013-10-19: qty 1

## 2013-10-19 MED ORDER — HYDROCODONE-ACETAMINOPHEN 5-325 MG PO TABS
2.0000 | ORAL_TABLET | Freq: Once | ORAL | Status: AC
  Administered 2013-10-19: 2 via ORAL
  Filled 2013-10-19: qty 2

## 2013-10-19 MED ORDER — CLINDAMYCIN HCL 150 MG PO CAPS: 150.0000 mg | ORAL_CAPSULE | Freq: Three times a day (TID) | ORAL | Status: DC

## 2013-10-19 MED ORDER — PROMETHAZINE HCL 12.5 MG PO TABS
12.5000 mg | ORAL_TABLET | Freq: Once | ORAL | Status: AC
  Administered 2013-10-19: 12.5 mg via ORAL
  Filled 2013-10-19: qty 1

## 2013-10-19 NOTE — Discharge Instructions (Signed)
You have an abscess of the left breast. Please call Dr. Lovell SheehanJenkins on Monday for a surgical appointment in the office as sone as possible. Please soak the abscess area and warm Epsom salt water for 15-20 minutes daily until you're seen by the surgeon. Please use clindamycin 3 times daily with food. Use Tylenol or ibuprofen for mild pain, use Percocet for more severe pain. Abscess An abscess is an infected area that contains a collection of pus and debris.It can occur in almost any part of the body. An abscess is also known as a furuncle or boil. CAUSES  An abscess occurs when tissue gets infected. This can occur from blockage of oil or sweat glands, infection of hair follicles, or a minor injury to the skin. As the body tries to fight the infection, pus collects in the area and creates pressure under the skin. This pressure causes pain. People with weakened immune systems have difficulty fighting infections and get certain abscesses more often.  SYMPTOMS Usually an abscess develops on the skin and becomes a painful mass that is red, warm, and tender. If the abscess forms under the skin, you may feel a moveable soft area under the skin. Some abscesses break open (rupture) on their own, but most will continue to get worse without care. The infection can spread deeper into the body and eventually into the bloodstream, causing you to feel ill.  DIAGNOSIS  Your caregiver will take your medical history and perform a physical exam. A sample of fluid may also be taken from the abscess to determine what is causing your infection. TREATMENT  Your caregiver may prescribe antibiotic medicines to fight the infection. However, taking antibiotics alone usually does not cure an abscess. Your caregiver may need to make a small cut (incision) in the abscess to drain the pus. In some cases, gauze is packed into the abscess to reduce pain and to continue draining the area. HOME CARE INSTRUCTIONS   Only take over-the-counter  or prescription medicines for pain, discomfort, or fever as directed by your caregiver.  If you were prescribed antibiotics, take them as directed. Finish them even if you start to feel better.  If gauze is used, follow your caregiver's directions for changing the gauze.  To avoid spreading the infection:  Keep your draining abscess covered with a bandage.  Wash your hands well.  Do not share personal care items, towels, or whirlpools with others.  Avoid skin contact with others.  Keep your skin and clothes clean around the abscess.  Keep all follow-up appointments as directed by your caregiver. SEEK MEDICAL CARE IF:   You have increased pain, swelling, redness, fluid drainage, or bleeding.  You have muscle aches, chills, or a general ill feeling.  You have a fever. MAKE SURE YOU:   Understand these instructions.  Will watch your condition.  Will get help right away if you are not doing well or get worse. Document Released: 03/03/2005 Document Revised: 11/23/2011 Document Reviewed: 08/06/2011 [facility] Patient Information 2014 [place], [place]LL[place].

## 2013-10-19 NOTE — ED Notes (Signed)
Abscess to left breast x 1 wk.

## 2013-10-19 NOTE — ED Provider Notes (Signed)
CSN: [id number]     Arrival date & time 10/19/13  1353 History   First MD Initiated Contact with Patient 10/19/13 1411     Chief Complaint  Patient presents with  . Abscess     (Consider location/radiation/quality/duration/timing/severity/associated sxs/prior Treatment) HPI Comments: Pt is a 23 y/o male who noted a "pimple" on the left breast 1 week ago, now notes significant swelling present. He reports to problem is 2 or 3 times larger than 1 week ago. He saw his MD and was told to follow up with the MD or come to the ED if it got worse. He has increased pain and swelling and became concerned that the infection may get to his "internal organs". No high fever. No drainage for the left nipple. No reported human bite.  Patient is a 23 y.o. male presenting with abscess. The history is provided by the patient.  Abscess Location:  Torso Torso abscess location: left breast.   Past Medical History  Diagnosis Date  . 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 Penile abscess    Past Surgical History  Procedure Laterality Date  . Hand surgery    . Irrigation and debridement abscess  09/14/2011    Procedure: IRRIGATION AND DEBRIDEMENT ABSCESS;  Surgeon: Ky BarbanMohammad I Javaid, MD;  Location: [facility];  Service: Urology;  Laterality: N/A;  Incision and drainage of penile abscess   Family History  Problem Relation Age of Onset  . Diabetes Mother   . Schizophrenia Father    History  Substance Use Topics  . Smoking status: Current Every Day Smoker -- 0.50 packs/day for 8 years    Types: Cigarettes  . Smokeless tobacco: Not on file  . Alcohol Use: 4.0 oz/week    8 drink(s) per week     Comment: occ    Review of Systems  Constitutional: Negative for activity change.       All ROS Neg except as noted in HPI  HENT: Negative for nosebleeds.   Eyes: Negative for photophobia and discharge.  Respiratory: Negative for cough, shortness of breath and wheezing.   Cardiovascular: Negative for chest pain and  palpitations.  Gastrointestinal: Negative for abdominal pain and blood in stool.  Genitourinary: Negative for dysuria, frequency and hematuria.  Musculoskeletal: Negative for arthralgias, back pain and neck pain.  Skin: Negative.   Neurological: Negative for dizziness, seizures and speech difficulty.  Psychiatric/Behavioral: Negative for hallucinations and confusion.      Allergies  Review of patient's allergies indicates no known allergies.  Home Medications   Prior to Admission medications   Medication Sig Start Date End Date Taking? Authorizing Provider  HYDROcodone-acetaminophen (NORCO/VICODIN) 5-325 MG per tablet Take 1 tablet by mouth every 4 (four) hours as needed. 07/29/13   Hope Orlene OchM Neese, NP   BP 127/75  Pulse 98  Temp(Src) 98.4 F (36.9 C) (Oral)  Resp 16  Ht 5\' 8"  (1.727 m)  Wt 222 lb (100.699 kg)  BMI 33.76 kg/m2  SpO2 99% Physical Exam  Nursing note and vitals reviewed. Constitutional: He is oriented to person, place, and time. He appears well-developed and well-nourished.  Non-toxic appearance.  HENT:  Head: Normocephalic.  Right Ear: Tympanic membrane and external ear normal.  Left Ear: Tympanic membrane and external ear normal.  Eyes: EOM and lids are normal. Pupils are equal, round, and reactive to light.  Neck: Normal range of motion. Neck supple. Carotid bruit is not present.  Cardiovascular: Normal rate, regular rhythm, normal heart sounds, intact distal pulses and normal pulses.  Pulmonary/Chest: Breath sounds normal. No respiratory distress.  Abdominal: Soft. Bowel sounds are normal. There is no tenderness. There is no guarding.  Musculoskeletal: Normal range of motion.  Red raised firm tender abscess area just under the left breast. There is no drainage from the nipple. There is no red streaks appreciated. The area is tender to even light touch.  Lymphadenopathy:       Head (right side): No submandibular adenopathy present.       Head (left side): No  submandibular adenopathy present.    He has no cervical adenopathy.  Neurological: He is alert and oriented to person, place, and time. He has normal strength. No cranial nerve deficit or sensory deficit.  Skin: Skin is warm and dry.  Psychiatric: He has a normal mood and affect. His speech is normal.    ED Course  Procedures (including critical care time) Labs Review Labs Reviewed - No data to display  Imaging Review No results found.   EKG Interpretation None      MDM Pt seen with me by Dr Clarene DukeMcManus. Discussed with the patient the need for surgical evaluation and management. Rocephin and doxycycline given in the ED. Cellulitis area marked on the left breast. Called Dr Lovell SheehanJenkins office and left message that Mr. Langston MaskerMorris would be calling for appointment concerning abscess of the left breast.  Rx for cleocin and percocet given. Pt now reports that his MD had written Rx for Bactrim, but he had not gotten if filled. Explained to the patient the severity of the infection and the need to take the antibiotic as prescribed.   Final diagnoses:  None    *I have reviewed nursing notes, vital signs, and all appropriate lab and imaging results for this patient.Kathie Dike**    Bernyce Brimley M Saurav Crumble, PA-C 10/20/13 [phone]

## 2013-10-19 NOTE — ED Notes (Signed)
Redness swelling around lt nipple.. Pt alert, NAD

## 2013-10-24 NOTE — ED Provider Notes (Signed)
Medical screening examination/treatment/procedure(s) were performed by non-physician practitioner and as supervising physician I was immediately available for consultation/collaboration.   EKG Interpretation None        Laray AngerKathleen M Armandina Iman, DO 10/24/13 1650

## 2013-11-08 ENCOUNTER — Encounter (HOSPITAL_COMMUNITY): Payer: Self-pay | Admitting: Emergency Medicine

## 2013-11-08 ENCOUNTER — Emergency Department (HOSPITAL_COMMUNITY)
Admission: EM | Admit: 2013-11-08 | Discharge: 2013-11-08 | Disposition: A | Discharge status: Home / Self Care | Payer: Self-pay | Attending: Emergency Medicine | Admitting: Emergency Medicine

## 2013-11-08 DIAGNOSIS — Z9889 Other specified postprocedural states: Secondary | ICD-10-CM | POA: Insufficient documentation

## 2013-11-08 DIAGNOSIS — L03119 Cellulitis of unspecified part of limb: Principal | ICD-10-CM

## 2013-11-08 DIAGNOSIS — Z87448 Personal history of other diseases of urinary system: Secondary | ICD-10-CM | POA: Insufficient documentation

## 2013-11-08 DIAGNOSIS — L02419 Cutaneous abscess of limb, unspecified: Secondary | ICD-10-CM | POA: Insufficient documentation

## 2013-11-08 DIAGNOSIS — Z8679 Personal history of other diseases of the circulatory system: Secondary | ICD-10-CM | POA: Insufficient documentation

## 2013-11-08 DIAGNOSIS — F17200 Nicotine dependence, unspecified, uncomplicated: Secondary | ICD-10-CM | POA: Insufficient documentation

## 2013-11-08 DIAGNOSIS — M6789 Other specified disorders of synovium and tendon, multiple sites: Secondary | ICD-10-CM | POA: Insufficient documentation

## 2013-11-08 DIAGNOSIS — L02416 Cutaneous abscess of left lower limb: Secondary | ICD-10-CM

## 2013-11-08 DIAGNOSIS — I498 Other specified cardiac arrhythmias: Secondary | ICD-10-CM | POA: Insufficient documentation

## 2013-11-08 DIAGNOSIS — Z792 Long term (current) use of antibiotics: Secondary | ICD-10-CM | POA: Insufficient documentation

## 2013-11-08 DIAGNOSIS — M71022 Abscess of bursa, left elbow: Secondary | ICD-10-CM

## 2013-11-08 DIAGNOSIS — IMO0002 Reserved for concepts with insufficient information to code with codable children: Secondary | ICD-10-CM | POA: Insufficient documentation

## 2013-11-08 MED ORDER — HYDROCODONE-ACETAMINOPHEN 5-325 MG PO TABS: 1.0000 | ORAL_TABLET | ORAL | Status: DC | PRN

## 2013-11-08 MED ORDER — LIDOCAINE HCL (PF) 2 % IJ SOLN
INTRAMUSCULAR | Status: AC
  Filled 2013-11-08: qty 10

## 2013-11-08 MED ORDER — SULFAMETHOXAZOLE-TRIMETHOPRIM 800-160 MG PO TABS: 1.0000 | ORAL_TABLET | Freq: Two times a day (BID) | ORAL | Status: AC

## 2013-11-08 NOTE — ED Provider Notes (Signed)
CSN: [id number]     Arrival date & time 11/08/13  1014 History   First MD Initiated Contact with Patient 11/08/13 1037     Chief Complaint  Patient presents with  . Abscess     (Consider location/radiation/quality/duration/timing/severity/associated sxs/prior Treatment) Patient is a 23 y.o. male presenting with abscess. The history is provided by the patient.  Abscess Location:  Leg and shoulder/arm Shoulder/arm abscess location:  L elbow Leg abscess location:  L knee Abscess quality: painful and redness   Red streaking: no   Duration:  2 days Progression:  Worsening Pain details:    Quality:  Aching and hot   Severity:  Moderate   Duration:  2 days   Timing:  Constant   Progression:  Worsening Chronicity:  New  RAAHIL BUB is a 23 y.o. male who presents to the ED with an abscess to the left elbow and left knee. He complains of pain, redness and swelling to both areas. He has had abscesses in the past.   Past Medical History  Diagnosis Date  . 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 Penile abscess    Past Surgical History  Procedure Laterality Date  . Hand surgery    . Irrigation and debridement abscess  09/14/2011    Procedure: IRRIGATION AND DEBRIDEMENT ABSCESS;  Surgeon: Ky Barban, MD;  Location: [facility];  Service: Urology;  Laterality: N/A;  Incision and drainage of penile abscess   Family History  Problem Relation Age of Onset  . Diabetes Mother   . Schizophrenia Father    History  Substance Use Topics  . Smoking status: Current Every Day Smoker -- 0.50 packs/day for 8 years    Types: Cigarettes  . Smokeless tobacco: Not on file  . Alcohol Use: 4.0 oz/week    8 drink(s) per week     Comment: occ    Review of Systems Negative except as stated in HPI   Allergies  Review of patient's allergies indicates no known allergies.  Home Medications   Prior to Admission medications   Medication Sig Start Date End Date Taking? Authorizing Provider  clindamycin  (CLEOCIN) 150 MG capsule Take 1 capsule (150 mg total) by mouth 3 (three) times daily. 10/19/13   Kathie Dike, PA-C  oxyCODONE-acetaminophen (PERCOCET/ROXICET) 5-325 MG per tablet Take 1 tablet by mouth every 6 (six) hours as needed for severe pain. 10/19/13   Kathie Dike, PA-C   BP 140/98  Pulse 104  Temp(Src) 99.5 F (37.5 C) (Oral)  Resp 18  Ht 5\' 9"  (1.753 m)  Wt 210 lb (95.255 kg)  BMI 31.00 kg/m2  SpO2 99% Physical Exam  Nursing note and vitals reviewed. Constitutional: He is oriented to person, place, and time. He appears well-developed and well-nourished. No distress.  HENT:  Head: Normocephalic.  Eyes: EOM are normal.  Neck: Neck supple.  Cardiovascular: Tachycardia present.   Pulmonary/Chest: Effort normal.  Musculoskeletal: Normal range of motion.       Arms:      Legs: There is a raised, pustular, tender area to the anterior aspect of the left knee. There is an area of erythema surrounding the pustular area but no streaking noted. Will I&D.  There is a raised, red, firm area to the dorsal aspect of the left elbow that is tender with palpation and flexion of the elbow. This area does not appear ready for I&D today.    Neurological: He is alert and oriented to person, place, and time. No cranial nerve  deficit.  Skin: Skin is warm and dry.  Psychiatric: He has a normal mood and affect. His behavior is normal.    ED Course  Procedures INCISION AND DRAINAGE Performed by: Jovonte Commins Orlene OchM Sohana Austell Consent: Verbal consent obtained. Risks and benefits: risks, benefits and alternatives were discussed Type: abscess  Body area: left knee  Cleaned with betadine  Anesthesia: local infiltration  Local anesthetic: lidocaine 1% without epinephrine  Anesthetic total: 2 ml  Incision made with # 11 blade  Complexity: complex Blunt dissection to break up loculations  Drainage: purulent  Drainage amount: moderate  Packing material: None  Patient tolerance: Patient  tolerated the procedure well with no immediate complications.    MDM  23 y.o. male with abscess to the left knee and left elbow. I&D of abscess of left knee with good results. Will start antibiotics for the both abscesses and the patient will continue to apply warm wet compresses to the areas. He will return in 2 days for recheck of both areas. He will return sooner for any problems.  Discussed with the patient and all questioned fully answered. He agrees with plan.    Medication List         HYDROcodone-acetaminophen 5-325 MG per tablet  Commonly known as:  NORCO/VICODIN  Take 1 tablet by mouth every 4 (four) hours as needed.     sulfamethoxazole-trimethoprim 800-160 MG per tablet  Commonly known as:  BACTRIM DS,SEPTRA DS  Take 1 tablet by mouth 2 (two) times daily.         [facility] Orlene OchM Chelly Dombeck, [place] 11/08/13 [phone]

## 2013-11-08 NOTE — Discharge Instructions (Signed)
Continue to soak the areas in warm water and clean with antibacterial soap. Return in 2 days for recheck or sooner for any problems.

## 2013-11-08 NOTE — ED Notes (Signed)
PT d/c to home with NAD. 

## 2013-11-08 NOTE — ED Notes (Signed)
PT noticed red raised painful area to anterior left knee 2 days ago. This am pt noticed red raised area to left elbow.

## 2013-11-08 NOTE — ED Provider Notes (Signed)
Medical screening examination/treatment/procedure(s) were performed by non-physician practitioner and as supervising physician I was immediately available for consultation/collaboration.  Devlin Brink L Analissa Bayless, MD 11/08/13 1435 

## 2013-11-11 ENCOUNTER — Encounter (HOSPITAL_COMMUNITY): Payer: Self-pay | Admitting: Emergency Medicine

## 2013-11-11 ENCOUNTER — Emergency Department (HOSPITAL_COMMUNITY)
Admission: EM | Admit: 2013-11-11 | Discharge: 2013-11-11 | Disposition: A | Discharge status: Home / Self Care | Payer: Self-pay | Attending: Emergency Medicine | Admitting: Emergency Medicine

## 2013-11-11 DIAGNOSIS — Z8679 Personal history of other diseases of the circulatory system: Secondary | ICD-10-CM | POA: Insufficient documentation

## 2013-11-11 DIAGNOSIS — M71062 Abscess of bursa, left knee: Secondary | ICD-10-CM

## 2013-11-11 DIAGNOSIS — L03119 Cellulitis of unspecified part of limb: Principal | ICD-10-CM

## 2013-11-11 DIAGNOSIS — Z87448 Personal history of other diseases of urinary system: Secondary | ICD-10-CM | POA: Insufficient documentation

## 2013-11-11 DIAGNOSIS — F17200 Nicotine dependence, unspecified, uncomplicated: Secondary | ICD-10-CM | POA: Insufficient documentation

## 2013-11-11 DIAGNOSIS — Z792 Long term (current) use of antibiotics: Secondary | ICD-10-CM | POA: Insufficient documentation

## 2013-11-11 DIAGNOSIS — L02419 Cutaneous abscess of limb, unspecified: Secondary | ICD-10-CM | POA: Insufficient documentation

## 2013-11-11 NOTE — ED Notes (Signed)
Here for wound recheck from I&D.

## 2013-11-11 NOTE — ED Notes (Signed)
Abscess recheck to left knee, pt did not have wound covered

## 2013-11-11 NOTE — ED Provider Notes (Signed)
CSN: [id number]     Arrival date & time 11/11/13  1540 History  This chart was scribed for non-physician practitioner Pauline Aus, PA-C working with Vanetta Mulders, MD by Danella Maiers, ED Scribe. This patient was seen in room [facility] and the patient's care was started at 4:10 PM.    Chief Complaint  Patient presents with  . Wound Check   Patient is a 23 y.o. male presenting with wound check. The history is provided by the patient. No language interpreter was used.  Wound Check This is a new problem. The current episode started more than 2 days ago. The problem has been gradually improving. Pertinent negatives include no chest pain, no abdominal pain, no headaches and no shortness of breath. The symptoms are aggravated by walking.   HPI Comments: Christian Rose is a 23 y.o. male who presents to the Emergency Department for a wound check from I&D of an abscess done here 4 days ago. He is taking his antibiotics. He states the swelling and redness have improved but there is still some mild swelling. It was draining up until yesterday. He states walking makes the pain worse. He denies fevers, chills, abdominal pain swelling or redness of the leg or vomiting.  Past Medical History  Diagnosis Date  . 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 Penile abscess    Past Surgical History  Procedure Laterality Date  . Hand surgery    . Irrigation and debridement abscess  09/14/2011    Procedure: IRRIGATION AND DEBRIDEMENT ABSCESS;  Surgeon: Ky Barban, MD;  Location: [facility];  Service: Urology;  Laterality: N/A;  Incision and drainage of penile abscess   Family History  Problem Relation Age of Onset  . Diabetes Mother   . Schizophrenia Father    History  Substance Use Topics  . Smoking status: Current Every Day Smoker -- 0.50 packs/day for 8 years    Types: Cigarettes  . Smokeless tobacco: Not on file  . Alcohol Use: 4.0 oz/week    8 drink(s) per week     Comment: occ    Review of Systems   Constitutional: Negative for fever, chills and fatigue.  HENT: Negative for sore throat and trouble swallowing.   Respiratory: Negative for cough, shortness of breath and wheezing.   Cardiovascular: Negative for chest pain and palpitations.  Gastrointestinal: Negative for nausea, vomiting, abdominal pain and blood in stool.  Genitourinary: Negative for dysuria, hematuria and flank pain.  Musculoskeletal: Negative for arthralgias, back pain, myalgias, neck pain and neck stiffness.  Skin: Positive for wound. Negative for rash.  Neurological: Negative for dizziness, weakness, numbness and headaches.  Hematological: Does not bruise/bleed easily.      Allergies  Review of patient's allergies indicates no known allergies.  Home Medications   Prior to Admission medications   Medication Sig Start Date End Date Taking? Authorizing Provider  HYDROcodone-acetaminophen (NORCO/VICODIN) 5-325 MG per tablet Take 1 tablet by mouth every 4 (four) hours as needed. 11/08/13   Hope Orlene Och, NP  sulfamethoxazole-trimethoprim (BACTRIM DS,SEPTRA DS) 800-160 MG per tablet Take 1 tablet by mouth 2 (two) times daily. 11/08/13 11/15/13  Hope Orlene Och, NP   BP 126/75  Pulse 75  Temp(Src) 98.6 F (37 C)  Resp 16  Ht 5\' 8"  (1.727 m)  Wt 210 lb (95.255 kg)  BMI 31.94 kg/m2  SpO2 99% Physical Exam  Nursing note and vitals reviewed. Constitutional: He is oriented to person, place, and time. He appears well-developed and well-nourished. No distress.  HENT:  Head: Normocephalic and atraumatic.  Mouth/Throat: Oropharynx is clear and moist.  Neck: Normal range of motion. Neck supple.  Cardiovascular: Normal rate, regular rhythm, normal heart sounds and intact distal pulses.   No murmur heard. Pulmonary/Chest: Effort normal and breath sounds normal. No respiratory distress. He exhibits no tenderness.  Abdominal: Soft. He exhibits no distension. There is no tenderness.  Musculoskeletal: Normal range of motion. He  exhibits no tenderness.  Pt has full ROM. NVI. Compartments of the left leg are soft.   Lymphadenopathy:    He has no cervical adenopathy.  Neurological: He is alert and oriented to person, place, and time. He exhibits normal muscle tone. Coordination normal.  Skin: Skin is warm and dry.  Pre-patellar abscess left knee appears to be healing well. No erythema. No soft tissue swelling of the joint. No drainage.     ED Course  Procedures (including critical care time) Medications - No data to display  DIAGNOSTIC STUDIES: Oxygen Saturation is 99% on RA, normal by my interpretation.    COORDINATION OF CARE: 4:20 PM- Discussed treatment plan with pt which includes discharge home. Pt agrees to plan.    Labs Review Labs Reviewed - No data to display  Imaging Review No results found.   EKG Interpretation None      MDM   Final diagnoses:  Abscess of bursa, left knee    Previous Ed chart reviewed, pt currently taking bactrim DS.  Healing abscess to the pre-patellar surface of the left knee.  No concerning sx's for involvement of the joint.  Pt ambulates with a steady gait.  NV intact.  VSS, pt is well appearing.  He agrees to continue abx, compresses or soaks, and to return here if needed,.   I personally performed the services described in this documentation, which was scribed in my presence. The recorded information has been reviewed and is accurate.   Nefertiti Mohamad L. Revere Maahs, PA-C 11/11/13 1641

## 2013-11-11 NOTE — Discharge Instructions (Signed)

## 2013-11-12 NOTE — ED Provider Notes (Signed)
Medical screening examination/treatment/procedure(s) were performed by non-physician practitioner and as supervising physician I was immediately available for consultation/collaboration.   EKG Interpretation None        Jaykwon Morones, MD 11/12/13 1519 

## 2014-02-19 ENCOUNTER — Emergency Department (HOSPITAL_COMMUNITY)
Admission: EM | Admit: 2014-02-19 | Discharge: 2014-02-19 | Disposition: A | Discharge status: Home / Self Care | Payer: Self-pay | Attending: Emergency Medicine | Admitting: Emergency Medicine

## 2014-02-19 ENCOUNTER — Emergency Department (HOSPITAL_COMMUNITY): Payer: Self-pay

## 2014-02-19 ENCOUNTER — Encounter (HOSPITAL_COMMUNITY): Payer: Self-pay | Admitting: Emergency Medicine

## 2014-02-19 DIAGNOSIS — M6789 Other specified disorders of synovium and tendon, multiple sites: Secondary | ICD-10-CM | POA: Insufficient documentation

## 2014-02-19 DIAGNOSIS — L02419 Cutaneous abscess of limb, unspecified: Secondary | ICD-10-CM | POA: Insufficient documentation

## 2014-02-19 DIAGNOSIS — F17200 Nicotine dependence, unspecified, uncomplicated: Secondary | ICD-10-CM | POA: Insufficient documentation

## 2014-02-19 DIAGNOSIS — Z8614 Personal history of Methicillin resistant Staphylococcus aureus infection: Secondary | ICD-10-CM | POA: Insufficient documentation

## 2014-02-19 DIAGNOSIS — L03119 Cellulitis of unspecified part of limb: Principal | ICD-10-CM

## 2014-02-19 DIAGNOSIS — M71021 Abscess of bursa, right elbow: Secondary | ICD-10-CM

## 2014-02-19 DIAGNOSIS — Z9889 Other specified postprocedural states: Secondary | ICD-10-CM | POA: Insufficient documentation

## 2014-02-19 DIAGNOSIS — L02416 Cutaneous abscess of left lower limb: Secondary | ICD-10-CM

## 2014-02-19 MED ORDER — SULFAMETHOXAZOLE-TRIMETHOPRIM 800-160 MG PO TABS: 1.0000 | ORAL_TABLET | Freq: Two times a day (BID) | ORAL | Status: DC

## 2014-02-19 MED ORDER — NAPROXEN 500 MG PO TABS: 500.0000 mg | ORAL_TABLET | Freq: Two times a day (BID) | ORAL | Status: DC

## 2014-02-19 MED ORDER — HYDROCODONE-ACETAMINOPHEN 5-325 MG PO TABS
1.0000 | ORAL_TABLET | Freq: Once | ORAL | Status: AC
  Administered 2014-02-19: 1 via ORAL
  Filled 2014-02-19: qty 1

## 2014-02-19 MED ORDER — SULFAMETHOXAZOLE-TMP DS 800-160 MG PO TABS
1.0000 | ORAL_TABLET | Freq: Once | ORAL | Status: AC
  Administered 2014-02-19: 1 via ORAL
  Filled 2014-02-19: qty 1

## 2014-02-19 NOTE — ED Notes (Signed)
Follow up care and discharge instructions given. Pt instructed to return in 2 days for wound check per EDP. Verbal understanding given by pt.

## 2014-02-19 NOTE — ED Provider Notes (Signed)
CSN: [id number]     Arrival date & time 02/19/14  1959 History   First MD Initiated Contact with Patient 02/19/14 2120     Chief Complaint  Patient presents with  . Elbow Pain  . Abscess     (Consider location/radiation/quality/duration/timing/severity/associated sxs/prior Treatment) Patient is a 23 y.o. male presenting with abscess. The history is provided by the patient.  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 The symptoms started a few days ago and are getting worse. 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 He denies fever or chills or other problems.   Past Medical History  Diagnosis Date  . 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 Penile abscess    Past Surgical History  Procedure Laterality Date  . Hand surgery    . Irrigation and debridement abscess  09/14/2011    Procedure: IRRIGATION AND DEBRIDEMENT ABSCESS;  Surgeon: Ky Barban, MD;  Location: [facility];  Service: Urology;  Laterality: N/A;  Incision and drainage of penile abscess   Family History  Problem Relation Age of Onset  . Diabetes Mother   . Schizophrenia Father    History  Substance Use Topics  . Smoking status: Current Every Day Smoker -- 0.50 packs/day for 8 years    Types: Cigarettes  . Smokeless tobacco: Not on file  . Alcohol Use: 4.0 oz/week    8 drink(s) per week     Comment: occ    Review of Systems Negative except as stated in HPI   Allergies  Review of patient's allergies indicates no known allergies.  Home Medications   Prior to Admission medications   Medication Sig Start Date End Date Taking? Authorizing Provider  HYDROcodone-acetaminophen (NORCO/VICODIN) 5-325 MG per tablet Take 1 tablet by mouth every 4 (four) hours as needed. 11/08/13   Hope Orlene Och, NP   BP 139/81  Pulse 88  Temp(Src) 99 F (37.2 C) (Oral)  Resp 18  Ht  (1.702 m)  Wt 236 lb (107.049 kg)  BMI  36.95 kg/m2  SpO2 99% Physical Exam  Nursing note and vitals reviewed. Constitutional: He is oriented to person, place, and time. He appears well-developed and well-nourished.  HENT:  Head: Normocephalic.  Eyes: Conjunctivae and EOM are normal.  Neck: Neck supple.  Cardiovascular: Normal rate.   Pulmonary/Chest: Effort normal.  Musculoskeletal:       Right elbow: He exhibits swelling. He exhibits normal range of motion, no deformity and no laceration. Tenderness found.       Arms: There is warmth, erythema and swelling to the right posterior elbow. Pain increases with flexion. Radial pulse strong, adequate circulation.  There is a tender, swollen area with erythema and a pustular center to the lateral aspect of the left hip.   Neurological: He is alert and oriented to person, place, and time. No cranial nerve deficit.  Skin: Skin is warm and dry.  Psychiatric: He has a normal mood and affect. His behavior is normal.    ED Course  Procedures (including critical care time) INCISION AND DRAINAGE Performed by: NEESE,HOPE Consent: Verbal consent obtained. Risks and benefits: risks, benefits and alternatives were discussed Type: abscess  Body area: left hip  Anesthesia: local infiltration  Local anesthetic: lidocaine 1% without epinephrine  Anesthetic total: 2 ml  Incision made with # 11 blade  Complexity: complex Blunt dissection to  break up loculations  Drainage: purulent  Irrigated with NSS  Drainage amount: small  Packing material: none  Patient tolerance: Patient tolerated the procedure well with no immediate complications.    Labs Review Dg Elbow Complete Right  02/19/2014   CLINICAL DATA:  Posterior right elbow pain and swelling.  EXAM: RIGHT ELBOW - COMPLETE 3+ VIEW  COMPARISON:  None.  FINDINGS: There is no evidence of fracture or dislocation. The visualized joint spaces are preserved. No significant joint effusion is identified. Soft swelling is noted dorsal  to the proximal ulna. The metallic BB is outside of the patient, reflecting a metallic marker.  IMPRESSION: No evidence of fracture or dislocation.   Electronically Signed   By: Roanna Raider M.D.   On: 02/19/2014 22:20    MDM  23 y.o. male with abscess to the left hip and redness and swelling to the right elbow. Will treat with Bactrim DS antibiotics and he will apply warm wet compresses to the area. He will return in 2 days for recheck or sooner for any problems. Stable for discharge without fever or red streaking noted.  Discussed with the patient and all questioned fully answered.       [facility] Orlene Och, NP 02/21/14 1700

## 2014-02-19 NOTE — ED Notes (Signed)
Pt has right elbow pain, and abscess to left hip.

## 2014-02-19 NOTE — Discharge Instructions (Signed)

## 2014-02-21 ENCOUNTER — Encounter (HOSPITAL_COMMUNITY): Payer: Self-pay | Admitting: Emergency Medicine

## 2014-02-21 ENCOUNTER — Emergency Department (HOSPITAL_COMMUNITY)
Admission: EM | Admit: 2014-02-21 | Discharge: 2014-02-21 | Disposition: A | Discharge status: Home / Self Care | Payer: Self-pay | Attending: Emergency Medicine | Admitting: Emergency Medicine

## 2014-02-21 DIAGNOSIS — Z87448 Personal history of other diseases of urinary system: Secondary | ICD-10-CM | POA: Insufficient documentation

## 2014-02-21 DIAGNOSIS — F17200 Nicotine dependence, unspecified, uncomplicated: Secondary | ICD-10-CM | POA: Insufficient documentation

## 2014-02-21 DIAGNOSIS — I498 Other specified cardiac arrhythmias: Secondary | ICD-10-CM | POA: Insufficient documentation

## 2014-02-21 DIAGNOSIS — M6789 Other specified disorders of synovium and tendon, multiple sites: Secondary | ICD-10-CM | POA: Insufficient documentation

## 2014-02-21 DIAGNOSIS — M71021 Abscess of bursa, right elbow: Secondary | ICD-10-CM

## 2014-02-21 DIAGNOSIS — M79609 Pain in unspecified limb: Secondary | ICD-10-CM | POA: Insufficient documentation

## 2014-02-21 MED ORDER — LIDOCAINE HCL (PF) 1 % IJ SOLN
5.0000 mL | Freq: Once | INTRAMUSCULAR | Status: AC
  Administered 2014-02-21: 5 mL
  Filled 2014-02-21: qty 5

## 2014-02-21 MED ORDER — IBUPROFEN 100 MG/5ML PO SUSP
ORAL | Status: AC
  Filled 2014-02-21: qty 10

## 2014-02-21 MED ORDER — CEPHALEXIN 500 MG PO CAPS: 500.0000 mg | ORAL_CAPSULE | Freq: Four times a day (QID) | ORAL | Status: DC

## 2014-02-21 NOTE — Discharge Instructions (Signed)
Abscess °An abscess (boil or furuncle) is an infected area on or under the skin. This area is filled with yellowish-white fluid (pus) and other material (debris). °HOME CARE  °· Only take medicines as told by your doctor. °· If you were given antibiotic medicine, take it as directed. Finish the medicine even if you start to feel better. °· If gauze is used, follow your doctor's directions for changing the gauze. °· To avoid spreading the infection: °¨ Keep your abscess covered with a bandage. °¨ Wash your hands well. °¨ Do not share personal care items, towels, or whirlpools with others. °¨ Avoid skin contact with others. °· Keep your skin and clothes clean around the abscess. °· Keep all doctor visits as told. °GET HELP RIGHT AWAY IF:  °· You have more pain, puffiness (swelling), or redness in the wound site. °· You have more fluid or blood coming from the wound site. °· You have muscle aches, chills, or you feel sick. °· You have a fever. °MAKE SURE YOU:  °· Understand these instructions. °· Will watch your condition. °· Will get help right away if you are not doing well or get worse. °Document Released: 11/10/2007 Document Revised: 11/23/2011 Document Reviewed: 08/06/2011 °[employer]® Patient Information ©2015 [employer]. This information is not intended to replace advice given to you by your health care provider. Make sure you discuss any questions you have with your health care provider. ° °

## 2014-02-21 NOTE — ED Notes (Signed)
Seen here 9/15 for abscess to lt hip, now has area on rt arm that is swollen and painful

## 2014-02-21 NOTE — ED Provider Notes (Signed)
CSN: [id number]     Arrival date & time 02/21/14  1531 History   First MD Initiated Contact with Patient 02/21/14 1606     Chief Complaint  Patient presents with  . Arm Pain     (Consider location/radiation/quality/duration/timing/severity/associated sxs/prior Treatment) HPI Christian Rose is a 23 y.o. male who returns to the ED for recheck of his abscess to his left hip and right elbow. The right hip is doing well since the I&D. Now the area to the right elbow has increased in size and is more tender. He is taking the Septra DS and applying warm wet compresses to the areas. He has not had fever or chills.   Past Medical History  Diagnosis Date  . 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 Penile abscess    Past Surgical History  Procedure Laterality Date  . Hand surgery    . Irrigation and debridement abscess  09/14/2011    Procedure: IRRIGATION AND DEBRIDEMENT ABSCESS;  Surgeon: Ky Barban, MD;  Location: [facility];  Service: Urology;  Laterality: N/A;  Incision and drainage of penile abscess   Family History  Problem Relation Age of Onset  . Diabetes Mother   . Schizophrenia Father    History  Substance Use Topics  . Smoking status: Current Every Day Smoker -- 0.50 packs/day for 8 years    Types: Cigarettes  . Smokeless tobacco: Not on file  . Alcohol Use: 4.0 oz/week    8 drink(s) per week     Comment: occ    Review of Systems Negative except as stated in HPI   Allergies  Review of patient's allergies indicates no known allergies.  Home Medications   Prior to Admission medications   Medication Sig Start Date End Date Taking? Authorizing Provider  acetaminophen (TYLENOL) 500 MG tablet Take 1,000 mg by mouth every 6 (six) hours as needed for moderate pain.   Yes Historical Provider, MD   BP 116/70  Pulse 73  Temp(Src) 99 F (37.2 C) (Oral)  Resp 18  Ht  (1.727 m)  Wt 236 lb (107.049 kg)  BMI 35.89 kg/m2  SpO2 99% Physical Exam  Nursing note and vitals  reviewed. Constitutional: He is oriented to person, place, and time. He appears well-developed and well-nourished.  HENT:  Head: Normocephalic.  Eyes: EOM are normal.  Neck: Neck supple.  Cardiovascular: Normal rate.   Pulmonary/Chest: Effort normal.  Musculoskeletal:       Right elbow: He exhibits swelling. Tenderness found.       Arms: Raised, tender area posterior aspect of the right elbow with erythema. No red streaking noted.   Neurological: He is alert and oriented to person, place, and time. No cranial nerve deficit.  Skin: Skin is warm and dry.  Psychiatric: He has a normal mood and affect. His behavior is normal.    ED Course  Procedures (including critical care time) INCISION AND DRAINAGE Performed by: Danisha Brassfield Consent: Verbal consent obtained. Risks and benefits: risks, benefits and alternatives were discussed Type: abscess  Body area: right elbow  Cleaned with betadine  Anesthesia: local infiltration  Local anesthetic: lidocaine 1% without epinephrine  Anesthetic total: 2 ml  Incision made with # 11 blade  Complexity: complex Blunt dissection to break up loculations  Drainage: purulent  Drainage amount: small  Packing material: none  Patient tolerance: Patient tolerated the procedure well with no immediate complications.  Imaging Review Dg Elbow Complete Right  02/19/2014   CLINICAL DATA:  Posterior right elbow pain  and swelling.  EXAM: RIGHT ELBOW - COMPLETE 3+ VIEW  COMPARISON:  None.  FINDINGS: There is no evidence of fracture or dislocation. The visualized joint spaces are preserved. No significant joint effusion is identified. Soft swelling is noted dorsal to the proximal ulna. The metallic BB is outside of the patient, reflecting a metallic marker.  IMPRESSION: No evidence of fracture or dislocation.   Electronically Signed   By: Roanna Raider M.D.   On: 02/19/2014 22:20    MDM  23 y.o. male with abscess to the right forearm and healing  abscess to the left lateral hip. Will add Keflex to his medications and he will continue to apply warm wet compresses to the areas. He will return if symptoms worsen. Discussed with the patient and all questioned fully answered.    Medication List    TAKE these medications       cephALEXin 500 MG capsule  Commonly known as:  KEFLEX  Take 1 capsule (500 mg total) by mouth 4 (four) times daily.      ASK your doctor about these medications       acetaminophen 500 MG tablet  Commonly known as:  TYLENOL  Take 1,000 mg by mouth every 6 (six) hours as needed for moderate pain.     sulfamethoxazole-trimethoprim 800-160 MG per tablet  Commonly known as:  BACTRIM DS  Take 1 tablet by mouth 2 (two) times daily.            [address] [place], [place] 02/22/14 [phone]

## 2014-02-28 NOTE — ED Provider Notes (Signed)
Medical screening examination/treatment/procedure(s) were performed by non-physician practitioner and as supervising physician I was immediately available for consultation/collaboration.   EKG Interpretation None        Tommye Lehenbauer, MD 02/28/14 0755 

## 2014-03-05 NOTE — ED Provider Notes (Signed)
Medical screening examination/treatment/procedure(s) were performed by non-physician practitioner and as supervising physician I was immediately available for consultation/collaboration.   EKG Interpretation None        Vanetta MuldersScott Fran Mcree, MD 03/05/14 1223

## 2014-03-14 ENCOUNTER — Encounter (HOSPITAL_COMMUNITY): Payer: Self-pay | Admitting: Emergency Medicine

## 2014-03-14 ENCOUNTER — Emergency Department (HOSPITAL_COMMUNITY): Payer: Self-pay

## 2014-03-14 ENCOUNTER — Emergency Department (HOSPITAL_COMMUNITY)
Admission: EM | Admit: 2014-03-14 | Discharge: 2014-03-14 | Disposition: A | Discharge status: Home / Self Care | Payer: Self-pay | Attending: Emergency Medicine | Admitting: Emergency Medicine

## 2014-03-14 DIAGNOSIS — W1830XA Fall on same level, unspecified, initial encounter: Secondary | ICD-10-CM | POA: Insufficient documentation

## 2014-03-14 DIAGNOSIS — Y9301 Activity, walking, marching and hiking: Secondary | ICD-10-CM | POA: Insufficient documentation

## 2014-03-14 DIAGNOSIS — Z720 Tobacco use: Secondary | ICD-10-CM | POA: Insufficient documentation

## 2014-03-14 DIAGNOSIS — Z79899 Other long term (current) drug therapy: Secondary | ICD-10-CM | POA: Insufficient documentation

## 2014-03-14 DIAGNOSIS — Z87448 Personal history of other diseases of urinary system: Secondary | ICD-10-CM | POA: Insufficient documentation

## 2014-03-14 DIAGNOSIS — Y929 Unspecified place or not applicable: Secondary | ICD-10-CM | POA: Insufficient documentation

## 2014-03-14 DIAGNOSIS — R000 Tachycardia, unspecified: Secondary | ICD-10-CM | POA: Insufficient documentation

## 2014-03-14 DIAGNOSIS — Z792 Long term (current) use of antibiotics: Secondary | ICD-10-CM | POA: Insufficient documentation

## 2014-03-14 DIAGNOSIS — S93402A Sprain of unspecified ligament of left ankle, initial encounter: Secondary | ICD-10-CM | POA: Insufficient documentation

## 2014-03-14 MED ORDER — HYDROCODONE-ACETAMINOPHEN 5-325 MG PO TABS: 1.0000 | ORAL_TABLET | ORAL | Status: DC | PRN

## 2014-03-14 MED ORDER — NAPROXEN 500 MG PO TABS: 500.0000 mg | ORAL_TABLET | Freq: Two times a day (BID) | ORAL | Status: DC

## 2014-03-14 NOTE — ED Notes (Signed)
Left ankle pain after " rolled my ankle on the sidewalk this morning" Bruising and swelling present to left foot.

## 2014-03-14 NOTE — ED Provider Notes (Signed)
CSN: [id number]     Arrival date & time 03/14/14  1324 History   First MD Initiated Contact with Patient 03/14/14 1001     Chief Complaint  Patient presents with  . Ankle Pain     (Consider location/radiation/quality/duration/timing/severity/associated sxs/prior Treatment) Patient is a 23 y.o. male presenting with ankle pain. The history is provided by the patient.  Ankle Pain Location:  Foot Time since incident:  4 hours Injury: yes   Mechanism of injury: fall   Fall:    Fall occurred:  Walking   Impact surface:  Concrete Pain details:    Quality:  Sharp   Radiates to:  Does not radiate   Severity:  Moderate   Onset quality:  Sudden   Timing:  Constant   Progression:  Worsening Chronicity:  New Foreign body present:  No foreign bodies Prior injury to area:  No Relieved by:  Nothing Worsened by:  Activity and bearing weight Ineffective treatments:  Ice  Christian Rose is a 23 y.o. male who presents to the ED with left lateral foot pain that started this am. He states he was taking the trash can to the house and stepped from the side walk to the curb and rolled his ankle. He applied ice and rested but has continued to hurt, have swelling and bruising.    Past Medical History  Diagnosis Date  . 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 Penile abscess    Past Surgical History  Procedure Laterality Date  . Hand surgery    . Irrigation and debridement abscess  09/14/2011    Procedure: IRRIGATION AND DEBRIDEMENT ABSCESS;  Surgeon: Ky Barban, MD;  Location: [facility];  Service: Urology;  Laterality: N/A;  Incision and drainage of penile abscess   Family History  Problem Relation Age of Onset  . Diabetes Mother   . Schizophrenia Father    History  Substance Use Topics  . Smoking status: Current Every Day Smoker -- 0.50 packs/day for 8 years    Types: Cigarettes  . Smokeless tobacco: Not on file  . Alcohol Use: 4.0 oz/week    8 drink(s) per week     Comment: occ    Review of  Systems All systems negative except as stated in HPI   Allergies  Review of patient's allergies indicates no known allergies.  Home Medications   Prior to Admission medications   Medication Sig Start Date End Date Taking? Authorizing Provider  acetaminophen (TYLENOL) 500 MG tablet Take 1,000 mg by mouth every 6 (six) hours as needed for moderate pain.    Historical Provider, MD  cephALEXin (KEFLEX) 500 MG capsule Take 1 capsule (500 mg total) by mouth 4 (four) times daily. 02/21/14   Briant Angelillo Orlene Och, NP  sulfamethoxazole-trimethoprim (BACTRIM DS) 800-160 MG per tablet Take 1 tablet by mouth 2 (two) times daily.    Historical Provider, MD   BP 137/89  Pulse 110  Temp(Src) 98.5 F (36.9 C) (Oral)  Resp 16  SpO2 97% Physical Exam  Nursing note and vitals reviewed. Constitutional: He is oriented to person, place, and time. He appears well-developed and well-nourished.  HENT:  Head: Normocephalic.  Eyes: EOM are normal.  Neck: Neck supple.  Cardiovascular: Tachycardia present.   Pulmonary/Chest: Effort normal.  Musculoskeletal:       Left ankle: Tenderness. Lateral malleolus tenderness found.  Neurological: He is alert and oriented to person, place, and time. No cranial nerve deficit.  Skin: Skin is warm and dry.  Psychiatric: He has  a normal mood and affect. His behavior is normal.    ED Course  Procedures  Dg Foot Complete Left  03/14/2014   CLINICAL DATA:  23 year old male with history of trauma after a twisting injury to the left foot, complaining of pain, swelling and bruising in the lateral aspect of the left foot.  EXAM: LEFT FOOT - COMPLETE 3+ VIEW  COMPARISON:  No priors.  FINDINGS: Multiple views of the left foot demonstrate no acute displaced fracture, subluxation, dislocation, or soft tissue abnormality.  IMPRESSION: No acute radiographic abnormality of the left foot.   Electronically Signed   By: Trudie Reedaniel  Entrikin M.D.   On: 03/14/2014 10:27    MDM  23 y.o. male with  left foot pain, swelling and bruising s/p injury earlier today. Ace wrap applied, post op shoe and crutches. Patient will continue ice and pain management. Stable for discharge without neurovascular deficits. I have reviewed this patient's vital signs, nurses notes, appropriate labs and imaging.  I have discussed findings with the patient and plan of care. He agrees with plan.    Medication List    TAKE these medications       HYDROcodone-acetaminophen 5-325 MG per tablet  Commonly known as:  NORCO/VICODIN  Take 1 tablet by mouth every 4 (four) hours as needed.     naproxen 500 MG tablet  Commonly known as:  NAPROSYN  Take 1 tablet (500 mg total) by mouth 2 (two) times daily.      ASK your doctor about these medications       acetaminophen 500 MG tablet  Commonly known as:  TYLENOL  Take 1,000 mg by mouth every 6 (six) hours as needed for moderate pain.            [facility] Orlene OchM Anahy Esh, [place] 03/15/14 [id number]

## 2014-03-14 NOTE — ED Notes (Signed)
Crutch teaching.   Patient with no complaints at this time. Respirations even and unlabored. Skin warm/dry. Discharge instructions reviewed with patient at this time. Patient given opportunity to voice concerns/ask questions. Patient discharged at this time and left Emergency Department with steady gait.

## 2014-03-16 NOTE — ED Provider Notes (Signed)
Medical screening examination/treatment/procedure(s) were performed by non-physician practitioner and as supervising physician I was immediately available for consultation/collaboration.   EKG Interpretation None        Guynell Kleiber F Davaun Quintela, MD 03/16/14 0246 

## 2014-12-25 ENCOUNTER — Encounter (HOSPITAL_COMMUNITY): Payer: Self-pay | Admitting: Emergency Medicine

## 2014-12-25 ENCOUNTER — Emergency Department (HOSPITAL_COMMUNITY)
Admission: EM | Admit: 2014-12-25 | Discharge: 2014-12-26 | Disposition: A | Discharge status: Home / Self Care | Payer: Medicaid Other | Attending: Emergency Medicine | Admitting: Emergency Medicine

## 2014-12-25 DIAGNOSIS — Z720 Tobacco use: Secondary | ICD-10-CM | POA: Diagnosis not present

## 2014-12-25 DIAGNOSIS — L0501 Pilonidal cyst with abscess: Secondary | ICD-10-CM | POA: Diagnosis present

## 2014-12-25 DIAGNOSIS — Z87448 Personal history of other diseases of urinary system: Secondary | ICD-10-CM | POA: Diagnosis not present

## 2014-12-25 MED ORDER — HYDROCODONE-ACETAMINOPHEN 5-325 MG PO TABS: 1.0000 | ORAL_TABLET | ORAL | Status: DC | PRN

## 2014-12-25 MED ORDER — HYDROMORPHONE HCL 1 MG/ML IJ SOLN
1.0000 mg | Freq: Once | INTRAMUSCULAR | Status: AC
  Administered 2014-12-25: 1 mg via INTRAMUSCULAR
  Filled 2014-12-25: qty 1

## 2014-12-25 MED ORDER — SULFAMETHOXAZOLE-TRIMETHOPRIM 800-160 MG PO TABS: 1.0000 | ORAL_TABLET | Freq: Two times a day (BID) | ORAL | Status: AC

## 2014-12-25 MED ORDER — LIDOCAINE HCL (PF) 1 % IJ SOLN
10.0000 mL | Freq: Once | INTRAMUSCULAR | Status: DC
  Filled 2014-12-25: qty 10

## 2014-12-25 NOTE — ED Notes (Signed)
Pt c/o abscess to buttocks  

## 2014-12-25 NOTE — Discharge Instructions (Signed)
°  Pilonidal Cyst, Care After °A pilonidal cyst occurs when hairs get trapped (ingrown) beneath the skin in the crease between the buttocks over your sacrum (the bone under that crease). Pilonidal cysts are most common in young men with a lot of body hair. When the cyst breaks(ruptured) or leaks, fluid from the cyst may cause burning and itching. If the cyst becomes infected, it causes a painful swelling filled with pus (abscess). The pus and trapped hairs need to be removed (often by lancing) so that the infection can heal. The word pilonidal means hair nest. °HOME CARE INSTRUCTIONS °If the pilonidal sinus was NOT DRAINING OR LANCED: °· Keep the area clean and dry. Bathe or shower daily. Wash the area well with a germ-killing soap. Hot tub baths may help prevent infection. Dry the area well with a towel. °· Avoid tight clothing in order to keep area as moisture-free as possible. °· Keep area between buttocks as free from hair as possible. A depilatory may be used. °· Take antibiotics as directed. °· Only take over-the-counter or prescription medicines for pain, discomfort, or fever as directed by your caregiver. °If the cyst WAS INFECTED AND NEEDED TO BE DRAINED: °· Your caregiver may have packed the wound with gauze to keep the wound open. This allows the wound to heal from the inside outward and continue to drain. °· Return as directed for a wound check. °· If you take tub baths or showers, repack the wound with gauze as directed following. Sponge baths are a good alternative. Sitz baths may be used three to four times a day or as directed. °· If an antibiotic was ordered to fight the infection, take as directed. °· Only take over-the-counter or prescription medicines for pain, discomfort, or fever as directed by your caregiver. °· If a drain was in place and removed, use sitz baths for 20 minutes 4 times per day. Clean the wound gently with mild unscented soap, pat dry, and then apply a dry dressing as  directed. °If you had surgery and IT WAS MARSUPIALIZED (LEFT OPEN): °· Your wound was packed with gauze to keep the wound open. This allows the wound to heal from the inside outwards and continue draining. The changing of the dressing regularly also helps keep the wound clean. °· Return as directed for a wound check. °· If you take tub baths or showers, repack the wound with gauze as directed following. Sponge baths are a good alternative. Sitz baths can also be used. This may be done three to four times a day or as directed. °· If an antibiotic was ordered to fight the infection, take as directed. °· Only take over-the-counter or prescription medicines for pain, discomfort, or fever as directed by your caregiver. °· If you had surgery and the wound was closed you may care for it as directed. This generally includes keeping it dry and clean and dressing it as directed. °SEEK MEDICAL CARE IF:  °· You have increased pain, swelling, redness, drainage, or bleeding from the area. °· You have a fever. °· You have muscles aches, dizziness, or a general ill feeling. °Document Released: 06/24/2006 Document Revised: 01/24/2013 Document Reviewed: 09/08/2006 °Exit[employer]® Patient Information ©2015 [employer]. This information is not intended to replace advice given to you by your health care provider. Make sure you discuss any questions you have with your health care provider. ° ° °

## 2014-12-28 NOTE — ED Provider Notes (Signed)
CSN: [id number]     Arrival date & time 12/25/14  2125 History   First MD Initiated Contact with Patient 12/25/14 2209     Chief Complaint  Patient presents with  . Abscess     (Consider location/radiation/quality/duration/timing/severity/associated sxs/prior Treatment) Patient is a 24 y.o. male presenting with abscess. The history is provided by the patient and the spouse.  Abscess Location:  Ano-genital Ano-genital abscess location:  Gluteal cleft Abscess quality: fluctuance, induration, painful and redness   Abscess quality: not draining   Red streaking: no   Duration:  2 days Progression:  Worsening Pain details:    Quality:  Sharp and throbbing   Severity:  Severe   Duration:  1 day   Timing:  Constant   Progression:  Worsening Chronicity:  New (He has a history of prior abscesses, but not at this site) Relieved by:  None tried Worsened by:  Nothing tried Ineffective treatments:  Warm compresses Associated symptoms: no fever, no nausea and no vomiting     Past Medical History  Diagnosis Date  . 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 Penile abscess    Past Surgical History  Procedure Laterality Date  . Hand surgery    . Irrigation and debridement abscess  09/14/2011    Procedure: IRRIGATION AND DEBRIDEMENT ABSCESS;  Surgeon: Ky Barban, MD;  Location: [facility];  Service: Urology;  Laterality: N/A;  Incision and drainage of penile abscess   Family History  Problem Relation Age of Onset  . Diabetes Mother   . Schizophrenia Father    History  Substance Use Topics  . Smoking status: Current Every Day Smoker -- 0.50 packs/day for 8 years    Types: Cigarettes  . Smokeless tobacco: Not on file  . Alcohol Use: 4.0 oz/week    8 drink(s) per week     Comment: occ    Review of Systems  Constitutional: Negative for fever and chills.  HENT: Negative.   Eyes: Negative.   Respiratory: Negative for chest tightness and shortness of breath.   Cardiovascular: Negative for chest  pain.  Gastrointestinal: Negative for nausea, vomiting and abdominal pain.  Genitourinary: Negative.   Musculoskeletal: Negative.   Skin: Negative.        Negative except as mentioned in HPI.    Neurological: Negative for weakness.  Psychiatric/Behavioral: Negative.       Allergies  Review of patient's allergies indicates no known allergies.  Home Medications   Prior to Admission medications   Medication Sig Start Date End Date Taking? Authorizing Provider  acetaminophen (TYLENOL) 500 MG tablet Take 1,000 mg by mouth every 6 (six) hours as needed for moderate pain.   Yes Historical Provider, MD  HYDROcodone-acetaminophen (NORCO/VICODIN) 5-325 MG per tablet Take 1 tablet by mouth every 4 (four) hours as needed. 12/25/14   Burgess Amor, PA-C  sulfamethoxazole-trimethoprim (BACTRIM DS,SEPTRA DS) 800-160 MG per tablet Take 1 tablet by mouth 2 (two) times daily. 12/25/14 01/01/15  Burgess Amor, PA-C   BP 139/88 mmHg  Pulse 89  Temp(Src) 98.6 F (37 C)  Resp 17  Ht 5\' 9"  (1.753 m)  Wt 190 lb (86.183 kg)  BMI 28.05 kg/m2  SpO2 100% Physical Exam  Constitutional: He is oriented to person, place, and time. He appears well-developed and well-nourished.  HENT:  Head: Normocephalic.  Cardiovascular: Normal rate.   Pulmonary/Chest: Effort normal.  Neurological: He is alert and oriented to person, place, and time. No sensory deficit.  Skin:  Pilonidal abscess, 2 cm raised erythematous  lesion, no red streaking, no spontaneous drainage.    ED Course  Procedures (including critical care time)   INCISION AND DRAINAGE Performed by: Burgess Amor Consent: Verbal consent obtained. Risks and benefits: risks, benefits and alternatives were discussed Type: abscess  Body area: pilonidal space  Anesthesia: local infiltration  Incision was made with a scalpel.  Local anesthetic: lidocaine 1% without epinephrine  Anesthetic total: 10 ml  Complexity: complex Blunt dissection to break up  loculations  Drainage: purulent  Drainage amount: moderate  Packing material: 1/4 in iodoform gauze  Patient tolerance: Patient tolerated the procedure fair with no immediate complications.  He continued to have discomfort despite using 10 cc of lidocaine.  Flushed site copiously until the abscess no longer flushed purulence.  Very small incision made given patient inability to tolerate larger incision.    Labs Review Labs Reviewed - No data to display  Imaging Review No results found.   EKG Interpretation None      MDM   Final diagnoses:  Pilonidal abscess    Given small incision, advised copious warm soaks to ensure site stays open as it continues to heal.  He did not tolerate packing.  He was placed on bactrim, hydrocodone. Advised f/u for any worsened sx.  The patient appears reasonably screened and/or stabilized for discharge and I doubt any other medical condition or other [facility] requiring further screening, evaluation, or treatment in the ED at this time prior to discharge.     Burgess Amor, PA-C 12/28/14 0010  Vanetta Mulders, MD 01/08/15 [phone]

## 2015-01-28 ENCOUNTER — Emergency Department (HOSPITAL_COMMUNITY)
Admission: EM | Admit: 2015-01-28 | Discharge: 2015-01-28 | Disposition: A | Discharge status: Home / Self Care | Payer: Medicaid Other | Attending: Emergency Medicine | Admitting: Emergency Medicine

## 2015-01-28 ENCOUNTER — Encounter (HOSPITAL_COMMUNITY): Payer: Self-pay | Admitting: *Deleted

## 2015-01-28 DIAGNOSIS — L02413 Cutaneous abscess of right upper limb: Secondary | ICD-10-CM | POA: Diagnosis present

## 2015-01-28 DIAGNOSIS — T798XXA Other early complications of trauma, initial encounter: Secondary | ICD-10-CM

## 2015-01-28 DIAGNOSIS — L089 Local infection of the skin and subcutaneous tissue, unspecified: Secondary | ICD-10-CM | POA: Diagnosis not present

## 2015-01-28 DIAGNOSIS — L02414 Cutaneous abscess of left upper limb: Secondary | ICD-10-CM | POA: Diagnosis not present

## 2015-01-28 DIAGNOSIS — Z720 Tobacco use: Secondary | ICD-10-CM | POA: Insufficient documentation

## 2015-01-28 MED ORDER — CIPROFLOXACIN HCL 250 MG PO TABS
500.0000 mg | ORAL_TABLET | Freq: Once | ORAL | Status: AC
  Administered 2015-01-28: 500 mg via ORAL
  Filled 2015-01-28: qty 2

## 2015-01-28 MED ORDER — BACITRACIN-NEOMYCIN-POLYMYXIN 400-5-5000 EX OINT
TOPICAL_OINTMENT | Freq: Once | CUTANEOUS | Status: AC
  Administered 2015-01-28: 1 via TOPICAL
  Filled 2015-01-28: qty 1

## 2015-01-28 MED ORDER — CIPROFLOXACIN HCL 500 MG PO TABS: 500.0000 mg | ORAL_TABLET | Freq: Two times a day (BID) | ORAL | Status: DC

## 2015-01-28 MED ORDER — DEXAMETHASONE 4 MG PO TABS: 4.0000 mg | ORAL_TABLET | Freq: Two times a day (BID) | ORAL | Status: AC

## 2015-01-28 MED ORDER — PREDNISONE 50 MG PO TABS
60.0000 mg | ORAL_TABLET | Freq: Once | ORAL | Status: AC
  Administered 2015-01-28: 60 mg via ORAL
  Filled 2015-01-28 (×2): qty 1

## 2015-01-28 NOTE — ED Notes (Signed)
Pt has abscess on his right arm that has been increasing in size and in pain since this morning. NAD noted.

## 2015-01-28 NOTE — ED Provider Notes (Signed)
CSN: [id number]     Arrival date & time 01/28/15  1643 History   First MD Initiated Contact with Patient 01/28/15 1737     Chief Complaint  Patient presents with  . Abscess     (Consider location/radiation/quality/duration/timing/severity/associated sxs/prior Treatment) HPI Comments: Patient is a 24 year old male who presents to the emergency department with a red raised area on his right arm. The patient states he has had infected areas and abscess areas on both of his arms. Earlier this morning he noticed a red raised area of his right arm. The patient states that he is in the garden a lot, but he does not remember a specific bite or steam. The area is not draining. He has not had any high fever to be reported. There's been no red streaking reported. The patient states he has some soreness, but it has not interfered with his activities of daily living. He presents now for additional evaluation of this particular issue.  The history is provided by the patient.    Past Medical History  Diagnosis Date  . 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 Penile abscess    Past Surgical History  Procedure Laterality Date  . Hand surgery    . Irrigation and debridement abscess  09/14/2011    Procedure: IRRIGATION AND DEBRIDEMENT ABSCESS;  Surgeon: Ky Barban, MD;  Location: [facility];  Service: Urology;  Laterality: N/A;  Incision and drainage of penile abscess   Family History  Problem Relation Age of Onset  . Diabetes Mother   . Schizophrenia Father    Social History  Substance Use Topics  . Smoking status: Current Every Day Smoker -- 0.50 packs/day for 8 years    Types: Cigarettes  . Smokeless tobacco: None  . Alcohol Use: 4.0 oz/week    8 drink(s) per week     Comment: occ    Review of Systems  Skin: Positive for wound.  All other systems reviewed and are negative.     Allergies  Review of patient's allergies indicates no known allergies.  Home Medications   Prior to Admission  medications   Medication Sig Start Date End Date Taking? Authorizing Provider  acetaminophen (TYLENOL) 500 MG tablet Take 1,000 mg by mouth every 6 (six) hours as needed for moderate pain.   Yes Historical Provider, MD   BP 134/78 mmHg  Pulse 99  Temp(Src) 97.7 F (36.5 C) (Oral)  Resp 18  Ht 5' 7.5" (1.715 m)  Wt 195 lb (88.451 kg)  BMI 30.07 kg/m2  SpO2 100% Physical Exam  Constitutional: He is oriented to person, place, and time. He appears well-developed and well-nourished.  Non-toxic appearance.  HENT:  Head: Normocephalic.  Right Ear: Tympanic membrane and external ear normal.  Left Ear: Tympanic membrane and external ear normal.  Eyes: EOM and lids are normal. Pupils are equal, round, and reactive to light.  Neck: Normal range of motion. Neck supple. Carotid bruit is not present.  Cardiovascular: Normal rate, regular rhythm, normal heart sounds, intact distal pulses and normal pulses.   Pulmonary/Chest: Breath sounds normal. No respiratory distress.  Abdominal: Soft. Bowel sounds are normal. There is no tenderness. There is no guarding.  Musculoskeletal: Normal range of motion.  There are several scabbed areas on the right and left arm. There is a raised red area with a scab in the center. There is no red streaks appreciated. The area is warm but not hot. There no palpable lymph nodes in the bicep tricep area. Is full range  of motion of the fingers, wrists, elbow, and shoulder on the right.  Lymphadenopathy:       Head (right side): No submandibular adenopathy present.       Head (left side): No submandibular adenopathy present.    He has no cervical adenopathy.  Neurological: He is alert and oriented to person, place, and time. He has normal strength. No cranial nerve deficit or sensory deficit.  Skin: Skin is warm and dry.  Psychiatric: He has a normal mood and affect. His speech is normal.  Nursing note and vitals reviewed.   ED Course  Procedures (including critical  care time) Labs Review Labs Reviewed - No data to display  Imaging Review No results found. I have personally reviewed and evaluated these images and lab results as part of my medical decision-making.   EKG Interpretation None      MDM  Patient has a red raised area on the right arm. The patient has several other scabbed areas on the right and left arm. The patient will be covered with Cipro twice a day for possible skin infection. He will be treated with Decadron as well. The patient is to cleanse the area with soap and water and to apply a bandage to protect the area until it resolves. The patient is in agreement with this discharge plan.    Final diagnoses:  None    **I have reviewed nursing notes, vital signs, and all appropriate lab and imaging results for this patient.Ivery Quale, PA-C 01/28/15 [place]  Dione Booze, MD 01/29/15 [phone]

## 2015-01-28 NOTE — Discharge Instructions (Signed)
Please cleanse the area of your right arm daily with soap and water. Please apply a Band-Aid to protect the area. Please use Cipro and Decadron daily with food until all taken.

## 2015-01-31 ENCOUNTER — Emergency Department (HOSPITAL_COMMUNITY): Payer: No Typology Code available for payment source

## 2015-01-31 ENCOUNTER — Emergency Department (HOSPITAL_COMMUNITY)
Admission: EM | Admit: 2015-01-31 | Discharge: 2015-01-31 | Disposition: A | Discharge status: Home / Self Care | Payer: No Typology Code available for payment source | Attending: Emergency Medicine | Admitting: Emergency Medicine

## 2015-01-31 ENCOUNTER — Encounter (HOSPITAL_COMMUNITY): Payer: Self-pay | Admitting: *Deleted

## 2015-01-31 DIAGNOSIS — S0990XA Unspecified injury of head, initial encounter: Secondary | ICD-10-CM | POA: Diagnosis not present

## 2015-01-31 DIAGNOSIS — S29002A Unspecified injury of muscle and tendon of back wall of thorax, initial encounter: Secondary | ICD-10-CM | POA: Diagnosis not present

## 2015-01-31 DIAGNOSIS — S199XXA Unspecified injury of neck, initial encounter: Secondary | ICD-10-CM | POA: Diagnosis present

## 2015-01-31 DIAGNOSIS — S161XXA Strain of muscle, fascia and tendon at neck level, initial encounter: Secondary | ICD-10-CM | POA: Diagnosis not present

## 2015-01-31 DIAGNOSIS — Z87438 Personal history of other diseases of male genital organs: Secondary | ICD-10-CM | POA: Diagnosis not present

## 2015-01-31 DIAGNOSIS — Y998 Other external cause status: Secondary | ICD-10-CM | POA: Insufficient documentation

## 2015-01-31 DIAGNOSIS — Z792 Long term (current) use of antibiotics: Secondary | ICD-10-CM | POA: Insufficient documentation

## 2015-01-31 DIAGNOSIS — Y9389 Activity, other specified: Secondary | ICD-10-CM | POA: Insufficient documentation

## 2015-01-31 DIAGNOSIS — Y92410 Unspecified street and highway as the place of occurrence of the external cause: Secondary | ICD-10-CM | POA: Insufficient documentation

## 2015-01-31 DIAGNOSIS — M546 Pain in thoracic spine: Secondary | ICD-10-CM

## 2015-01-31 DIAGNOSIS — Z720 Tobacco use: Secondary | ICD-10-CM | POA: Insufficient documentation

## 2015-01-31 DIAGNOSIS — Z7952 Long term (current) use of systemic steroids: Secondary | ICD-10-CM | POA: Diagnosis not present

## 2015-01-31 MED ORDER — CYCLOBENZAPRINE HCL 10 MG PO TABS: 10.0000 mg | ORAL_TABLET | Freq: Two times a day (BID) | ORAL | Status: AC | PRN

## 2015-01-31 MED ORDER — IBUPROFEN 800 MG PO TABS: 800.0000 mg | ORAL_TABLET | Freq: Three times a day (TID) | ORAL | Status: DC
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
## 2017-06-23 ENCOUNTER — Emergency Department (HOSPITAL_COMMUNITY)
Admission: EM | Admit: 2017-06-23 | Discharge: 2017-06-23 | Disposition: A | Discharge status: Home / Self Care | Payer: Self-pay | Attending: Emergency Medicine | Admitting: Emergency Medicine

## 2017-06-23 ENCOUNTER — Encounter (HOSPITAL_COMMUNITY): Payer: Self-pay | Admitting: Emergency Medicine

## 2017-06-23 ENCOUNTER — Other Ambulatory Visit: Payer: Self-pay

## 2017-06-23 DIAGNOSIS — F17210 Nicotine dependence, cigarettes, uncomplicated: Secondary | ICD-10-CM | POA: Insufficient documentation

## 2017-06-23 DIAGNOSIS — K047 Periapical abscess without sinus: Secondary | ICD-10-CM | POA: Insufficient documentation

## 2017-06-23 DIAGNOSIS — Z79899 Other long term (current) drug therapy: Secondary | ICD-10-CM | POA: Insufficient documentation

## 2017-06-23 MED ORDER — HYDROCODONE-ACETAMINOPHEN 5-325 MG PO TABS
1.0000 | ORAL_TABLET | Freq: Once | ORAL | Status: AC
  Administered 2017-06-23: 1 via ORAL
  Filled 2017-06-23: qty 1

## 2017-06-23 MED ORDER — AMOXICILLIN 500 MG PO CAPS: 500.0000 mg | ORAL_CAPSULE | Freq: Three times a day (TID) | ORAL | 0 refills | Status: AC

## 2017-06-23 MED ORDER — HYDROCODONE-ACETAMINOPHEN 5-325 MG PO TABS: 1.0000 | ORAL_TABLET | ORAL | 0 refills | Status: DC | PRN

## 2017-06-23 MED ORDER — IBUPROFEN 800 MG PO TABS: 800.0000 mg | ORAL_TABLET | Freq: Three times a day (TID) | ORAL | 0 refills | Status: AC

## 2017-06-23 MED ORDER — AMOXICILLIN 250 MG PO CAPS
500.0000 mg | ORAL_CAPSULE | Freq: Once | ORAL | Status: AC
  Administered 2017-06-23: 500 mg via ORAL
  Filled 2017-06-23: qty 2

## 2017-06-23 MED ORDER — IBUPROFEN 800 MG PO TABS
800.0000 mg | ORAL_TABLET | Freq: Once | ORAL | Status: AC
  Administered 2017-06-23: 800 mg via ORAL
  Filled 2017-06-23: qty 1

## 2017-06-23 NOTE — ED Triage Notes (Signed)
Chronic dental pain to right lower. Has apt next week but pain too bad today. Nad.

## 2017-06-23 NOTE — Discharge Instructions (Signed)
Complete your entire course of antibiotics as prescribed.  You  may use the hydrocodone for pain relief but do not drive within 4 hours of taking as this will make you drowsy.  Avoid applying heat or ice to this abscess area which can worsen your symptoms.  You may use warm salt water swish and spit treatment or half peroxide and water swish and spit after meals to keep this area clean as discussed.   °
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
## 2020-05-20 ENCOUNTER — Emergency Department (HOSPITAL_COMMUNITY)
Admission: EM | Admit: 2020-05-20 | Discharge: 2020-05-20 | Disposition: A | Discharge status: Home / Self Care | Payer: Self-pay | Attending: Emergency Medicine | Admitting: Emergency Medicine

## 2020-05-20 ENCOUNTER — Encounter (HOSPITAL_COMMUNITY): Payer: Self-pay | Admitting: Emergency Medicine

## 2020-05-20 ENCOUNTER — Other Ambulatory Visit: Payer: Self-pay

## 2020-05-20 ENCOUNTER — Emergency Department (HOSPITAL_COMMUNITY): Payer: Self-pay

## 2020-05-20 DIAGNOSIS — W260XXA Contact with knife, initial encounter: Secondary | ICD-10-CM | POA: Insufficient documentation

## 2020-05-20 DIAGNOSIS — F17210 Nicotine dependence, cigarettes, uncomplicated: Secondary | ICD-10-CM | POA: Insufficient documentation

## 2020-05-20 DIAGNOSIS — S41112A Laceration without foreign body of left upper arm, initial encounter: Secondary | ICD-10-CM | POA: Insufficient documentation

## 2020-05-20 DIAGNOSIS — Y92838 Other recreation area as the place of occurrence of the external cause: Secondary | ICD-10-CM | POA: Insufficient documentation

## 2020-05-20 MED ORDER — LIDOCAINE HCL (PF) 1 % IJ SOLN: 30.0000 mL | Freq: Once | INTRAMUSCULAR | Status: AC

## 2020-05-20 MED ORDER — HYDROCODONE-ACETAMINOPHEN 5-325 MG PO TABS
1.0000 | ORAL_TABLET | Freq: Once | ORAL | Status: AC
  Administered 2020-05-20: 1 via ORAL
  Filled 2020-05-20: qty 1

## 2020-05-20 MED ORDER — IOHEXOL 350 MG/ML SOLN
100.0000 mL | Freq: Once | INTRAVENOUS | Status: AC | PRN
  Administered 2020-05-20: 100 mL via INTRAVENOUS

## 2020-05-20 MED ORDER — HYDROCODONE-ACETAMINOPHEN 5-325 MG PO TABS: 1.0000 | ORAL_TABLET | ORAL | 0 refills | Status: DC | PRN

## 2020-05-20 MED ORDER — LIDOCAINE HCL (PF) 1 % IJ SOLN
INTRAMUSCULAR | Status: AC
  Filled 2020-05-20: qty 10
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
## 2020-05-20 NOTE — Discharge Instructions (Addendum)
You were evaluated in the Emergency Department and after careful evaluation, we did not find any emergent condition requiring admission or further testing in the hospital.  Your exam/testing today is overall reassuring.  We suspect you have some nerve damage from the injury.  We recommend calling Dr. Buren Kos office for a follow-up appointment within the next 1 to 2 weeks.  You will also need your sutures removed in 10 to 14 days by a healthcare professional.  Use Tylenol or Motrin for discomfort, you can use the Norco tablets as needed for more significant pain.  Use the arm sling as needed for comfort but please remove it and range the shoulder multiple times per day to prevent the shoulder from freezing up.  Please return to the Emergency Department if you experience any worsening of your condition.   Thank you for allowing [place] to be a part of your care.
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
## 2020-06-11 ENCOUNTER — Other Ambulatory Visit: Payer: Self-pay

## 2020-06-11 ENCOUNTER — Encounter (HOSPITAL_COMMUNITY): Payer: Self-pay | Admitting: Emergency Medicine

## 2020-06-11 ENCOUNTER — Emergency Department (HOSPITAL_COMMUNITY)
Admission: EM | Admit: 2020-06-11 | Discharge: 2020-06-11 | Disposition: A | Discharge status: Home / Self Care | Payer: Self-pay | Attending: Emergency Medicine | Admitting: Emergency Medicine

## 2020-06-11 DIAGNOSIS — X58XXXD Exposure to other specified factors, subsequent encounter: Secondary | ICD-10-CM | POA: Insufficient documentation

## 2020-06-11 DIAGNOSIS — Z4802 Encounter for removal of sutures: Secondary | ICD-10-CM | POA: Insufficient documentation

## 2020-06-11 DIAGNOSIS — F17210 Nicotine dependence, cigarettes, uncomplicated: Secondary | ICD-10-CM | POA: Insufficient documentation

## 2020-06-11 DIAGNOSIS — S41112D Laceration without foreign body of left upper arm, subsequent encounter: Secondary | ICD-10-CM | POA: Insufficient documentation

## 2020-06-11 NOTE — ED Triage Notes (Signed)
Pt to the ED for suture removal.
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
## 2020-06-11 NOTE — Discharge Instructions (Addendum)
Keep the wound clean and bandaged.  Follow-up with the orthopedic as directed previously.  Return to the ER if needed.

## 2020-10-03 ENCOUNTER — Other Ambulatory Visit: Payer: Self-pay

## 2020-10-03 ENCOUNTER — Emergency Department (HOSPITAL_COMMUNITY): Payer: Self-pay

## 2020-10-03 ENCOUNTER — Emergency Department (HOSPITAL_COMMUNITY)
Admission: EM | Admit: 2020-10-03 | Discharge: 2020-10-03 | Disposition: A | Discharge status: Home / Self Care | Payer: Self-pay | Attending: Emergency Medicine | Admitting: Emergency Medicine

## 2020-10-03 DIAGNOSIS — Y9355 Activity, bike riding: Secondary | ICD-10-CM | POA: Insufficient documentation

## 2020-10-03 DIAGNOSIS — F17210 Nicotine dependence, cigarettes, uncomplicated: Secondary | ICD-10-CM | POA: Insufficient documentation

## 2020-10-03 DIAGNOSIS — S52122A Displaced fracture of head of left radius, initial encounter for closed fracture: Secondary | ICD-10-CM | POA: Insufficient documentation

## 2020-10-03 DIAGNOSIS — W19XXXA Unspecified fall, initial encounter: Secondary | ICD-10-CM

## 2020-10-03 MED ORDER — TRAMADOL HCL 50 MG PO TABS
50.0000 mg | ORAL_TABLET | Freq: Four times a day (QID) | ORAL | 0 refills | Status: AC | PRN
Start: 2020-10-03 — End: ?
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
## 2020-10-03 NOTE — Discharge Instructions (Signed)
Follow-up with Dr.Cairns next week

## 2021-05-19 ENCOUNTER — Encounter (HOSPITAL_COMMUNITY): Payer: Self-pay | Admitting: *Deleted

## 2021-05-19 ENCOUNTER — Emergency Department (HOSPITAL_COMMUNITY)
Admission: EM | Admit: 2021-05-19 | Discharge: 2021-05-19 | Disposition: A | Discharge status: Home / Self Care | Payer: Self-pay | Attending: Emergency Medicine | Admitting: Emergency Medicine

## 2021-05-19 DIAGNOSIS — L0291 Cutaneous abscess, unspecified: Secondary | ICD-10-CM

## 2021-05-19 DIAGNOSIS — F17210 Nicotine dependence, cigarettes, uncomplicated: Secondary | ICD-10-CM | POA: Insufficient documentation

## 2021-05-19 DIAGNOSIS — L0231 Cutaneous abscess of buttock: Secondary | ICD-10-CM | POA: Insufficient documentation

## 2021-05-19 MED ORDER — LIDOCAINE-EPINEPHRINE (PF) 2 %-1:200000 IJ SOLN
10.0000 mL | Freq: Once | INTRAMUSCULAR | Status: AC
  Administered 2021-05-19: 10 mL
  Filled 2021-05-19: qty 20

## 2021-05-19 MED ORDER — HYDROCODONE-ACETAMINOPHEN 5-325 MG PO TABS
1.0000 | ORAL_TABLET | Freq: Once | ORAL | Status: AC
Start: 2021-05-19 — End: 2021-05-19
  Administered 2021-05-19: 1 via ORAL
  Filled 2021-05-19: qty 1

## 2021-05-19 MED ORDER — AMOXICILLIN-POT CLAVULANATE 875-125 MG PO TABS: 1.0000 | ORAL_TABLET | Freq: Two times a day (BID) | ORAL | 0 refills | Status: DC

## 2021-05-19 NOTE — Discharge Instructions (Addendum)
You were seen in the emergency department for an abscess.  As we discussed we do not get the drainage from this area like we expected.  However it might be a little too early for this.  I am prescribing you an antibiotic that I like you to take twice daily for the next week.  It is important to finish the entire course of this.  As Dr. Wilkie Aye discussed with you, if you have any new or worsening symptoms, please return to the emergency department for further evaluation including possible imaging.  The symptoms may include but are not limited to fevers, chills, nausea, vomiting, rectal pain, or change in your stools.

## 2021-05-19 NOTE — ED Provider Notes (Signed)
[facility] EMERGENCY DEPARTMENT Provider Note   CSN: [id number] Arrival date & time: 05/19/21  1101     History Chief Complaint  Patient presents with   Abscess    Christian Rose is a 30 y.o. male who presents the emergency department with complaint of pilonidal abscess.  Patient states that he has a history of these before, requiring incision and drainage.  He states that he noticed pain in his backside starting yesterday, he took a hot shower, and when he went to use the bathroom this morning he felt more pain and noticed some purulent drainage.  He denies fevers, chills, nausea, vomiting, abdominal pain, rectal pain.   Abscess Associated symptoms: no fever, no nausea and no vomiting       Past Medical History:  Diagnosis Date   Bradycardia 09/15/2011   Penile abscess     Patient Active Problem List   Diagnosis Date Noted   Bradycardia 09/15/2011   Thrombocytopenia (HCC) 09/13/2011   Cellulitis and abscess of other specified site 09/11/2011   Tobacco abuse 09/11/2011    Past Surgical History:  Procedure Laterality Date   HAND SURGERY     IRRIGATION AND DEBRIDEMENT ABSCESS  09/14/2011   Procedure: IRRIGATION AND DEBRIDEMENT ABSCESS;  Surgeon: Ky Barban, MD;  Location: [facility];  Service: Urology;  Laterality: N/A;  Incision and drainage of penile abscess       Family History  Problem Relation Age of Onset   Diabetes Mother    Schizophrenia Father     Social History   Tobacco Use   Smoking status: Every Day    Packs/day: 0.50    Years: 8.00    Pack years: 4.00    Types: Cigarettes   Smokeless tobacco: Never  Vaping Use   Vaping Use: Never used  Substance Use Topics   Alcohol use: Yes    Alcohol/week: 8.0 standard drinks    Types: 8 Standard drinks or equivalent per week    Comment: occ   Drug use: Yes    Types: Marijuana    Comment: occasionally    Home Medications Prior to Admission medications   Medication Sig Start Date End Date  Taking? Authorizing Provider  acetaminophen (TYLENOL) 500 MG tablet Take 1,000 mg by mouth every 6 (six) hours as needed for moderate pain.   Yes [provider]  amoxicillin-clavulanate (AUGMENTIN) 875-125 MG tablet Take 1 tablet by mouth every 12 (twelve) hours. 05/19/21  Yes Nissa Stannard T, PA-C  ciprofloxacin (CIPRO) 500 MG tablet Take 1 tablet (500 mg total) by mouth 2 (two) times daily. Patient not taking: Reported on 05/19/2021 01/28/15   Ivery Quale, PA-C  cyclobenzaprine (FLEXERIL) 10 MG tablet Take 1 tablet (10 mg total) by mouth 2 (two) times daily as needed for muscle spasms. Patient not taking: Reported on 05/19/2021 01/31/15   Teressa Lower, NP  dexamethasone (DECADRON) 4 MG tablet Take 1 tablet (4 mg total) by mouth 2 (two) times daily with a meal. Patient not taking: Reported on 05/19/2021 01/28/15   Ivery Quale, PA-C  HYDROcodone-acetaminophen (NORCO/VICODIN) 5-325 MG tablet Take 1 tablet by mouth every 4 (four) hours as needed. Patient not taking: Reported on 05/19/2021 05/20/20   Sabas Sous, MD  ibuprofen (ADVIL,MOTRIN) 800 MG tablet Take 1 tablet (800 mg total) by mouth 3 (three) times daily. Patient not taking: Reported on 05/19/2021 06/23/17   Burgess Amor, PA-C  traMADol (ULTRAM) 50 MG tablet Take 1 tablet (50 mg total) by mouth every  6 (six) hours as needed. Patient not taking: Reported on 05/19/2021 10/03/20   Bethann Berkshire, MD    Allergies    Patient has no known allergies.  Review of Systems   Review of Systems  Constitutional:  Negative for chills and fever.  Gastrointestinal:  Negative for abdominal pain, constipation, diarrhea, nausea and vomiting.  Genitourinary:  Negative for difficulty urinating.  Skin:        Skin abscess  All other systems reviewed and are negative.  Physical Exam Updated Vital Signs BP 129/85    Pulse (!) 57    Temp 98 F (36.7 C) (Oral)    Resp 18    Wt 108.1 kg    SpO2 97%    BMI 36.24 kg/m   Physical  Exam Vitals and nursing note reviewed.  Constitutional:      Appearance: Normal appearance.  HENT:     Head: Normocephalic and atraumatic.  Eyes:     Conjunctiva/sclera: Conjunctivae normal.  Pulmonary:     Effort: Pulmonary effort is normal. No respiratory distress.  Skin:    General: Skin is warm and dry.     Comments: Approximately 2-1/2 cm area of induration, limited fluctuance.  There appears to be break in the skin, with no obvious drainage.  Neurological:     Mental Status: He is alert.  Psychiatric:        Mood and Affect: Mood normal.        Behavior: Behavior normal.    ED Results / Procedures / Treatments   Labs (all labs ordered are listed, but only abnormal results are displayed) Labs Reviewed - No data to display  EKG None  Radiology No results found.  Procedures .Marland KitchenIncision and Drainage  Date/Time: 05/19/2021 1:35 PM Performed by: Su Monks, PA-C Authorized by: Su Monks, PA-C   Consent:    Consent obtained:  Verbal   Consent given by:  Patient   Risks discussed:  Bleeding and incomplete drainage Location:    Type:  Abscess   Location:  Anogenital   Anogenital location:  Gluteal cleft Pre-procedure details:    Skin preparation:  Chlorhexidine with alcohol Anesthesia:    Anesthesia method:  Local infiltration   Local anesthetic:  Lidocaine 2% WITH epi Procedure type:    Complexity:  Complex Procedure details:    Needle aspiration: yes     Incision types:  Single straight   Wound management:  Probed and deloculated   Drainage:  Bloody   Wound treatment:  Wound left open Post-procedure details:    Procedure completion:  Tolerated Comments:     Second attempt performed by Dr. Coralee Pesa   Medications Ordered in ED Medications  HYDROcodone-acetaminophen (NORCO/VICODIN) 5-325 MG per tablet 1 tablet (has no administration in time range)  lidocaine-EPINEPHrine (XYLOCAINE W/EPI) 2 %-1:200000 (PF) injection 10 mL (10 mLs  Infiltration Given 05/19/21 1233)    ED Course  I have reviewed the triage vital signs and the nursing notes.  Pertinent labs & imaging results that were available during my care of the patient were reviewed by me and considered in my medical decision making (see chart for details).    MDM Rules/Calculators/A&P                           Patient is a 30 year old otherwise healthy male who presents to the emergency department with complaint of a pilonidal abscess.  My exam patient is afebrile, not tachycardic, no  acute distress.  He has an approximately 2 and half centimeter area of induration on the right gluteal cleft, with some extension to the left side with minimal fluctuance.  There appears to be a break in the skin where it seems there might have been drainage before, but there is no drainage on my exam.  Incision and performed at bedside, with minimal purulent drainage.  Discussed with patient like to leave this area open, placed on antibiotics.  I do not think he is requiring admission or inpatient treatment for his symptoms at this time.  Discussed close return precautions to include any new or worsening symptoms, that he should return to the emergency department for likely further evaluation including potential imaging.  I discussed this case with my attending physician Dr. Wilkie Aye who cosigned this note including patient's presenting symptoms, physical exam, and planned diagnostics and interventions. Attending physician stated agreement with plan or made changes to plan which were implemented.    Final Clinical Impression(s) / ED Diagnoses Final diagnoses:  Abscess    Rx / DC Orders ED Discharge Orders          Ordered    amoxicillin-clavulanate (AUGMENTIN) 875-125 MG tablet  Every 12 hours        05/19/21 1325             Cordarryl Monrreal T, PA-C 05/19/21 1338    Horton, Clabe Seal, DO 05/21/21 [phone]

## 2021-05-19 NOTE — ED Triage Notes (Signed)
Abscess on buttocks, history of same

## 2021-05-19 NOTE — ED Notes (Signed)
LET and lac tray at bedside for I&D. PA aware

## 2021-09-12 ENCOUNTER — Emergency Department (HOSPITAL_COMMUNITY)
Admission: EM | Admit: 2021-09-12 | Discharge: 2021-09-12 | Disposition: A | Discharge status: Home / Self Care | Payer: Self-pay | Attending: Emergency Medicine | Admitting: Emergency Medicine

## 2021-09-12 ENCOUNTER — Other Ambulatory Visit: Payer: Self-pay

## 2021-09-12 ENCOUNTER — Encounter (HOSPITAL_COMMUNITY): Payer: Self-pay

## 2021-09-12 ENCOUNTER — Emergency Department (HOSPITAL_COMMUNITY): Payer: Self-pay

## 2021-09-12 DIAGNOSIS — S51832A Puncture wound without foreign body of left forearm, initial encounter: Secondary | ICD-10-CM | POA: Insufficient documentation

## 2021-09-12 DIAGNOSIS — Z23 Encounter for immunization: Secondary | ICD-10-CM | POA: Insufficient documentation

## 2021-09-12 DIAGNOSIS — S51812A Laceration without foreign body of left forearm, initial encounter: Secondary | ICD-10-CM

## 2021-09-12 MED ORDER — LIDOCAINE-EPINEPHRINE (PF) 1 %-1:200000 IJ SOLN
10.0000 mL | Freq: Once | INTRAMUSCULAR | Status: AC
  Administered 2021-09-12: 10 mL via INTRADERMAL
  Filled 2021-09-12: qty 30

## 2021-09-12 MED ORDER — TETANUS-DIPHTH-ACELL PERTUSSIS 5-2.5-18.5 LF-MCG/0.5 IM SUSY
0.5000 mL | PREFILLED_SYRINGE | Freq: Once | INTRAMUSCULAR | Status: AC
  Administered 2021-09-12: 0.5 mL via INTRAMUSCULAR
  Filled 2021-09-12: qty 0.5

## 2021-09-12 MED ORDER — CEPHALEXIN 500 MG PO CAPS
500.0000 mg | ORAL_CAPSULE | Freq: Once | ORAL | Status: AC
  Administered 2021-09-12: 500 mg via ORAL
  Filled 2021-09-12: qty 1

## 2021-09-12 MED ORDER — HYDROCODONE-ACETAMINOPHEN 5-325 MG PO TABS: ORAL_TABLET | ORAL | 0 refills | Status: AC

## 2021-09-12 MED ORDER — IBUPROFEN 800 MG PO TABS
800.0000 mg | ORAL_TABLET | Freq: Once | ORAL | Status: AC
  Administered 2021-09-12: 800 mg via ORAL
  Filled 2021-09-12: qty 1

## 2021-09-12 MED ORDER — LIDOCAINE HCL (PF) 2 % IJ SOLN
5.0000 mL | Freq: Once | INTRAMUSCULAR | Status: DC
Start: 2021-09-12 — End: 2021-09-12

## 2021-09-12 MED ORDER — POVIDONE-IODINE 10 % EX SOLN
CUTANEOUS | Status: DC | PRN
  Filled 2021-09-12: qty 14.8

## 2021-09-12 MED ORDER — CEPHALEXIN 500 MG PO CAPS: 500.0000 mg | ORAL_CAPSULE | Freq: Three times a day (TID) | ORAL | 0 refills | Status: AC

## 2021-09-12 NOTE — ED Notes (Signed)
Warm Blankets applied.
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
## 2021-09-12 NOTE — Discharge Instructions (Signed)
Your x-rays did not show any injury to the bone.  I recommend you elevate your arm when possible, you may apply ice packs to help reduce swelling.  Clean the wound with mild soap and water and keep it bandaged for the first few days.  Sutures will need to be removed in 8 to 10 days.  Take the antibiotic as directed until it is finished.  Return to the emergency department for any signs of infection such as increasing pain, red streaking, fever or chills. ?
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
